# Patient Record
Sex: Female | Born: 1985 | Race: White | Hispanic: No | Marital: Married | Smoking: Never smoker
Health system: Southern US, Community
[De-identification: ages and names within clinical notes are randomized; demographics above are authoritative.]

## PROBLEM LIST (undated history)

## (undated) ENCOUNTER — Inpatient Hospital Stay (HOSPITAL_COMMUNITY): Payer: Self-pay

## (undated) DIAGNOSIS — E079 Disorder of thyroid, unspecified: Secondary | ICD-10-CM

## (undated) DIAGNOSIS — K219 Gastro-esophageal reflux disease without esophagitis: Secondary | ICD-10-CM

## (undated) DIAGNOSIS — D649 Anemia, unspecified: Secondary | ICD-10-CM

## (undated) DIAGNOSIS — E282 Polycystic ovarian syndrome: Secondary | ICD-10-CM

## (undated) HISTORY — DX: Gastro-esophageal reflux disease without esophagitis: K21.9

## (undated) HISTORY — DX: Anemia, unspecified: D64.9

---
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
## 2010-06-30 ENCOUNTER — Ambulatory Visit (HOSPITAL_COMMUNITY): Admission: RE | Admit: 2010-06-30 | Discharge: 2010-06-30 | Payer: Self-pay | Admitting: Obstetrics and Gynecology

## 2010-07-20 ENCOUNTER — Ambulatory Visit (HOSPITAL_COMMUNITY)
Admission: RE | Admit: 2010-07-20 | Discharge: 2010-07-20 | Payer: Self-pay | Source: Home / Self Care | Admitting: Obstetrics and Gynecology

## 2010-09-02 ENCOUNTER — Inpatient Hospital Stay (HOSPITAL_COMMUNITY): Admission: AD | Admit: 2010-09-02 | Payer: Self-pay | Admitting: Obstetrics and Gynecology

## 2010-10-25 ENCOUNTER — Inpatient Hospital Stay (HOSPITAL_COMMUNITY)
Admission: AD | Admit: 2010-10-25 | Discharge: 2010-10-25 | Disposition: A | Discharge status: Home / Self Care | Payer: PRIVATE HEALTH INSURANCE | Source: Ambulatory Visit | Attending: Obstetrics and Gynecology | Admitting: Obstetrics and Gynecology

## 2010-10-25 DIAGNOSIS — O4700 False labor before 37 completed weeks of gestation, unspecified trimester: Secondary | ICD-10-CM | POA: Insufficient documentation

## 2010-10-29 ENCOUNTER — Inpatient Hospital Stay (HOSPITAL_COMMUNITY)
Admission: AD | Admit: 2010-10-29 | Discharge: 2010-10-29 | Disposition: A | Discharge status: Home / Self Care | Payer: PRIVATE HEALTH INSURANCE | Source: Ambulatory Visit | Attending: Obstetrics and Gynecology | Admitting: Obstetrics and Gynecology

## 2010-10-29 DIAGNOSIS — O4700 False labor before 37 completed weeks of gestation, unspecified trimester: Secondary | ICD-10-CM | POA: Insufficient documentation

## 2010-11-10 ENCOUNTER — Inpatient Hospital Stay (HOSPITAL_COMMUNITY)
Admission: AD | Admit: 2010-11-10 | Discharge: 2010-11-10 | Disposition: A | Discharge status: Home / Self Care | Payer: PRIVATE HEALTH INSURANCE | Source: Ambulatory Visit | Attending: Obstetrics and Gynecology | Admitting: Obstetrics and Gynecology

## 2010-11-10 DIAGNOSIS — O479 False labor, unspecified: Secondary | ICD-10-CM | POA: Insufficient documentation

## 2010-11-23 ENCOUNTER — Inpatient Hospital Stay (HOSPITAL_COMMUNITY)
Admission: AD | Admit: 2010-11-23 | Discharge: 2010-11-26 | DRG: 775 | Disposition: A | Discharge status: Home / Self Care | Payer: PRIVATE HEALTH INSURANCE | Source: Ambulatory Visit | Attending: Obstetrics and Gynecology | Admitting: Obstetrics and Gynecology

## 2010-11-23 DIAGNOSIS — Z22330 Carrier of Group B streptococcus: Secondary | ICD-10-CM

## 2010-11-23 DIAGNOSIS — O99892 Other specified diseases and conditions complicating childbirth: Secondary | ICD-10-CM | POA: Diagnosis present

## 2010-11-23 LAB — CBC
HCT: 33.1 % — ABNORMAL LOW (ref 36.0–46.0)
Hemoglobin: 10.2 g/dL — ABNORMAL LOW (ref 12.0–15.0)
MCH: 24.1 pg — ABNORMAL LOW (ref 26.0–34.0)
MCHC: 30.8 g/dL (ref 30.0–36.0)
MCV: 78.1 fL (ref 78.0–100.0)
RDW: 16.3 % — ABNORMAL HIGH (ref 11.5–15.5)

## 2010-11-25 LAB — CBC
HCT: 29.4 % — ABNORMAL LOW (ref 36.0–46.0)
MCH: 24 pg — ABNORMAL LOW (ref 26.0–34.0)
MCV: 78.4 fL (ref 78.0–100.0)
Platelets: 192 10*3/uL (ref 150–400)
RDW: 16.2 % — ABNORMAL HIGH (ref 11.5–15.5)

## 2011-09-09 IMAGING — US US OB DETAIL+14 WK
1 series · 12 of 13 positions shown · non-contrast
Comparison: none

[Series 1: us ob detail+14 wk · 12 of 13 slices shown]
[im 1/13]
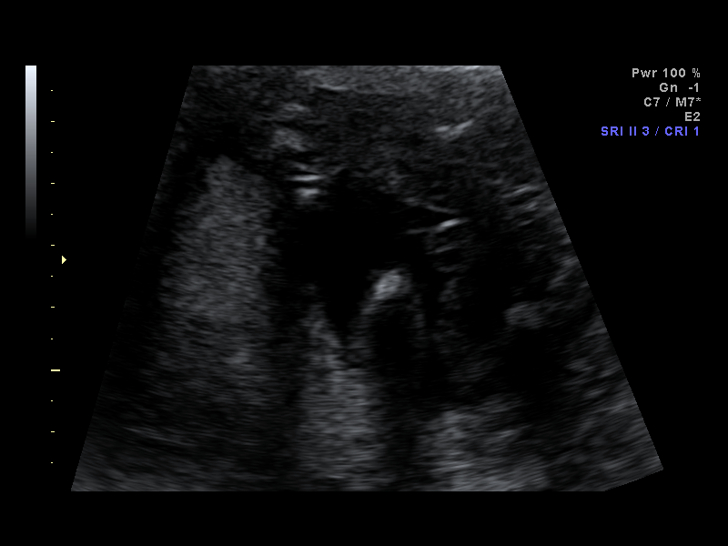
[im 2/13]
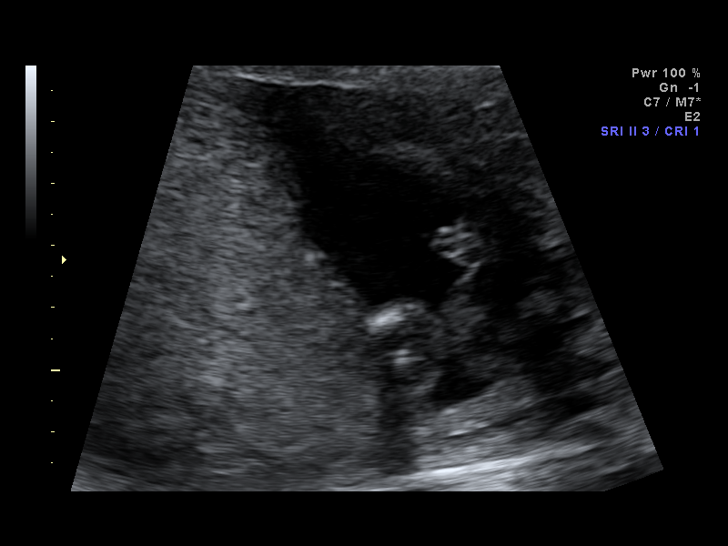
[im 3/13]
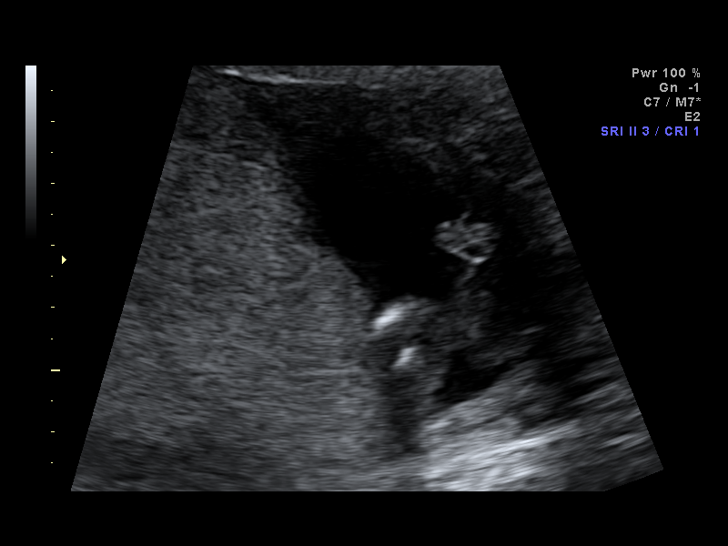
[im 4/13]
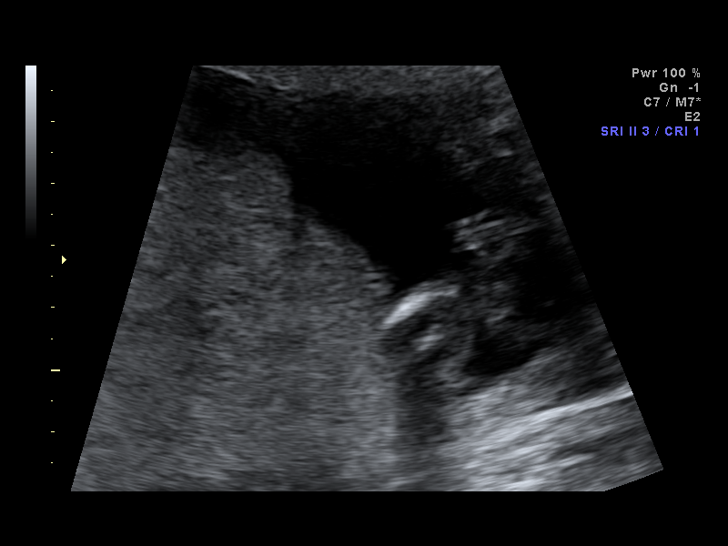
[im 5/13]
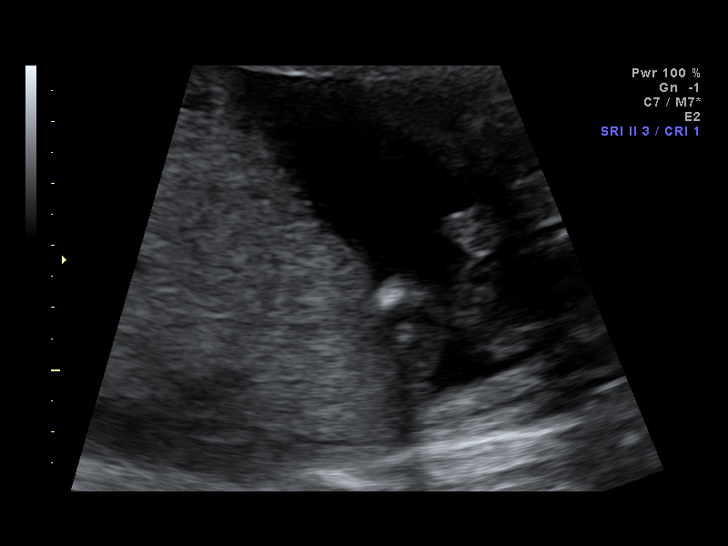
[im 6/13]
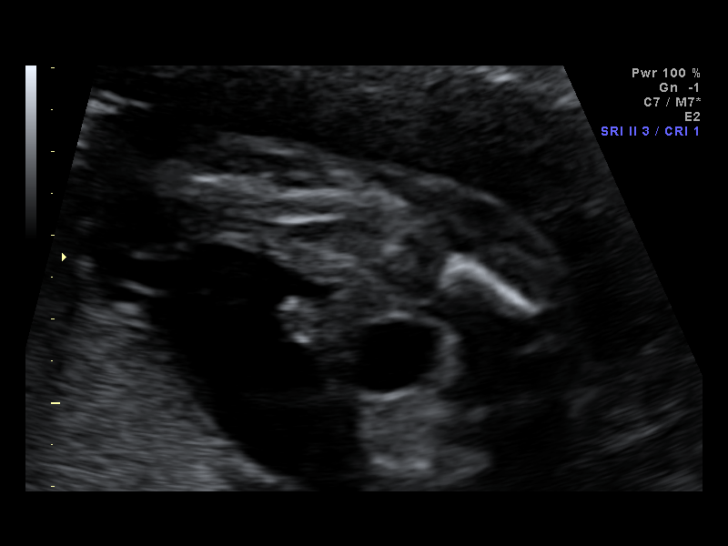
[im 8/13]
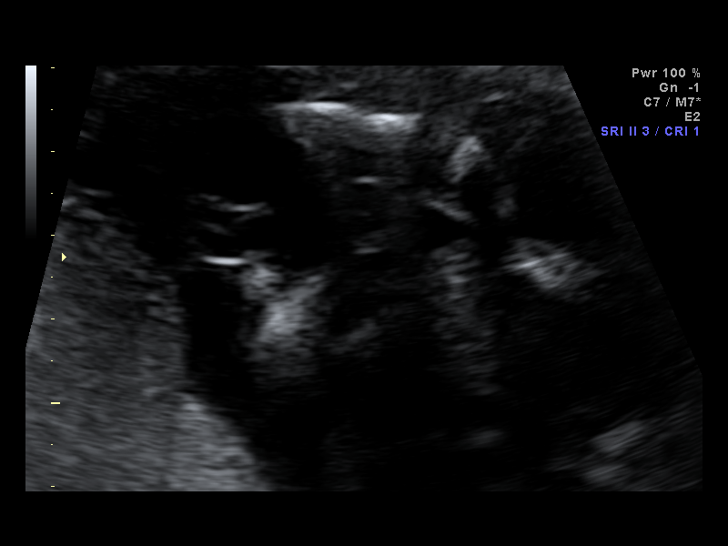
[im 9/13]
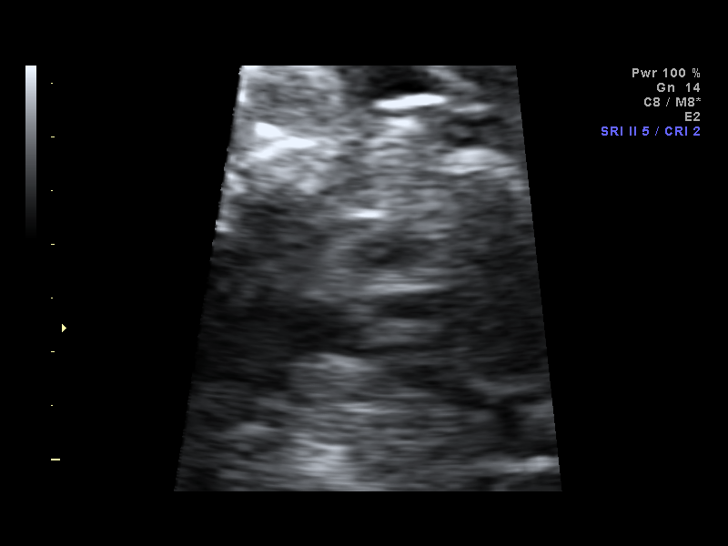
[im 10/13]
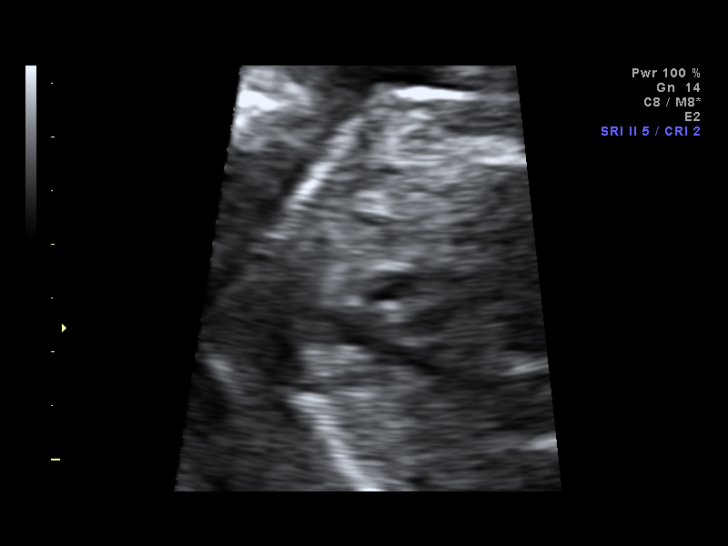
[im 11/13]
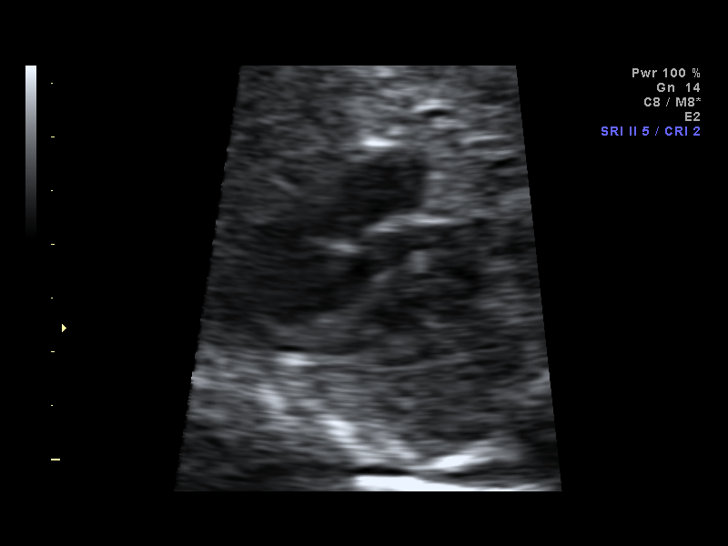
[im 12/13]
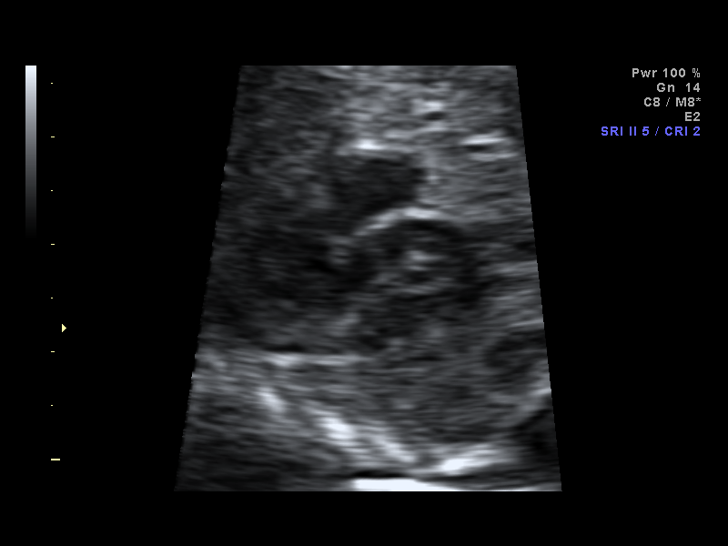
[im 13/13]
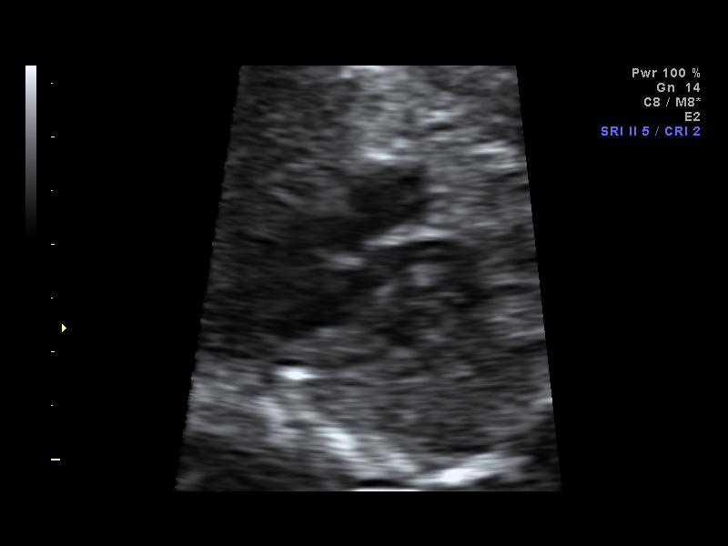

[12 of 13 positions shown; findings below may reference images not displayed]

OBSTETRICS REPORT
                      (Signed Final 08/01/2010 [DATE])

 Order#:         _O
Procedures

 US OB DETAIL +14 WK                                   76811.0
Indications

 Detailed fetal anatomic survey
 Low amniotic fluuid on outside scan
 Obesity
Fetal Evaluation

 Fetal Heart Rate:  145                          bpm
 Cardiac Activity:  Observed
 Presentation:      Breech
 Placenta:          Posterior, above cervical
                    os

 Amniotic Fluid
 AFI FV:      Subjectively within normal limits
                                             Larg Pckt:     3.6  cm
Biometry

 BPD:     42.7  mm     G. Age:  18w 6d                CI:         72.7   70 - 86
 OFD:     58.7  mm                                    FL/HC:      18.8   16.1 -

 HC:     163.7  mm     G. Age:  19w 1d       42  %    HC/AC:      1.15   1.09 -

 AC:       142  mm     G. Age:  19w 4d       59  %    FL/BPD:
 FL:      30.7  mm     G. Age:  19w 4d       56  %    FL/AC:      21.6   20 - 24
 HUM:     29.3  mm     G. Age:  19w 4d       61  %
 CER:     21.2  mm     G. Age:  20w 1d       72  %
 NFT:     3.28  mm

 Est. FW:     295  gm    0 lb 10 oz      51  %
Gestational Age

 U/S Today:     19w 2d                                        EDD:   11/22/10
 Best:          19w 1d     Det. By:  Previous Ultrasound      EDD:   11/23/10
Anatomy
 Cranium:           Appears normal      Aortic Arch:       Appears normal
 Fetal Cavum:       Appears normal      Ductal Arch:       Appears normal
 Ventricles:        Appears normal      Diaphragm:         Appears normal
 Choroid Plexus:    Appears normal      Stomach:           Appears normal
 Cerebellum:        Appears normal      Abdomen:           Appears normal
 Posterior Fossa:   Appears normal      Abdominal Wall:    Appears nml
                                                           (cord insert,
                                                           abd wall)
 Nuchal Fold:       Appears normal      Cord Vessels:      Appears normal
                    (neck, nuchal                          (3 vessel cord)
                    fold)
 Face:              Appears normal      Kidneys:           Appear normal
                    (lips/profile/orbit
                    s)
 Heart:             Appears normal      Bladder:           Appears normal
                    (4 chamber &
                    axis)
 RVOT:              Appears normal      Spine:             Appears normal
 LVOT:              Appears normal      Limbs:             Appears normal
                                                           (hands, ankles,
                                                           feet)

 Other:     Technically difficult due to  maternal habitus. Heels and
            5th digit visualized.
Cervix Uterus Adnexa

 Cervical Length:    3.4      cm

 Cervix:       Normal appearance by transabdominal scan.
 Left Ovary:    Not seen separate form a simple cyst 6.5x8.7x8.5 cm
 Right Ovary:   Within normal limits.
Impression

 IUP at 19+1 weeks
 Normal level II fetal anatomy; gender not easily determined -
 ? ambiguous: small phallus identified
 Markers of aneuploidy: none
 Normal amniotic fluid volume
 Measurements consistant with prior U/S

 The US findings were shared with Ms. Wilmer Ricardo and her
 husband. We briefly talked the possibility of the fetus having
 ambiguous genitalia and the implications of that diagnosis
 prenatally.
Recommendations

 Follow-up ultrasound for reassessment of genitalia in 3
 weeks

 questions or concerns.

## 2011-09-29 IMAGING — US US OB FOLLOW-UP
1 series · 12 of 28 positions shown · non-contrast
Comparison: none

[Series 1: us ob follow-up · 0.17mm/px · 12 of 52 slices shown]
[im 2/52]
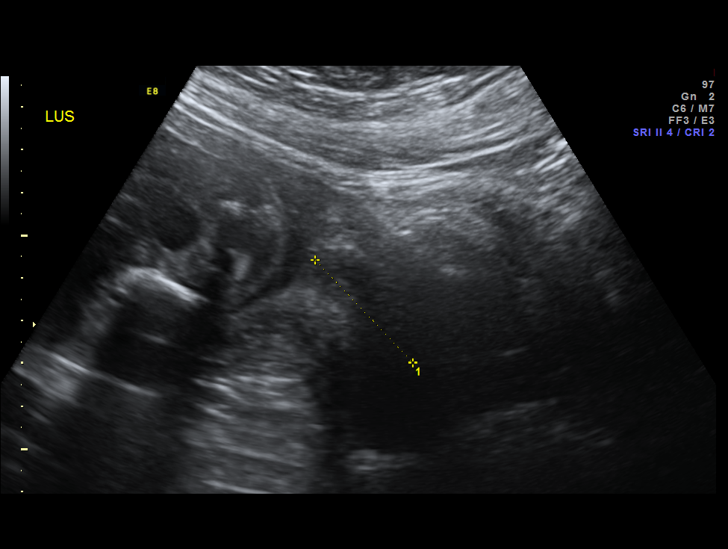
[im 6/52]
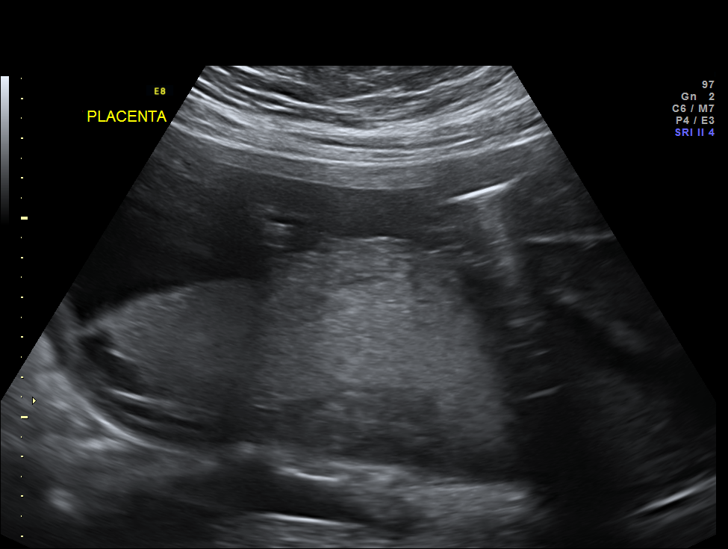
[im 10/52]
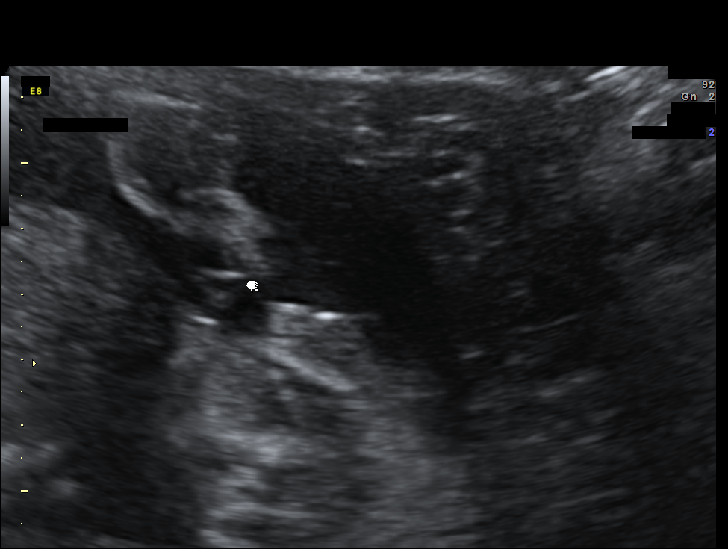
[im 16/52]
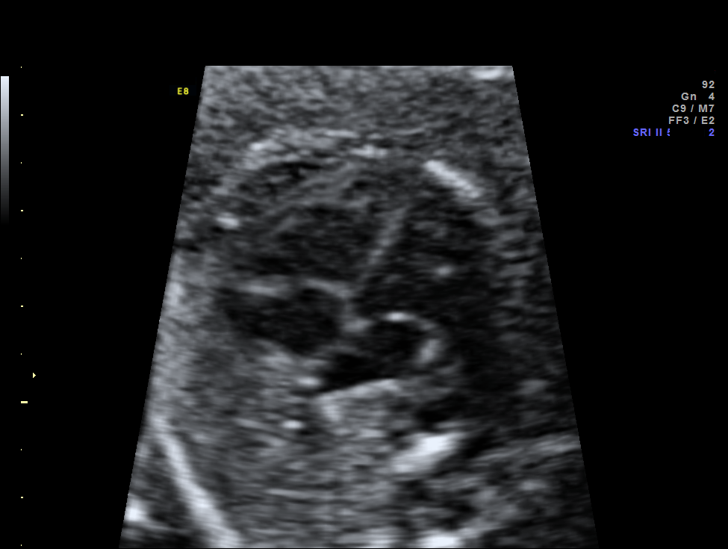
[im 19/52]
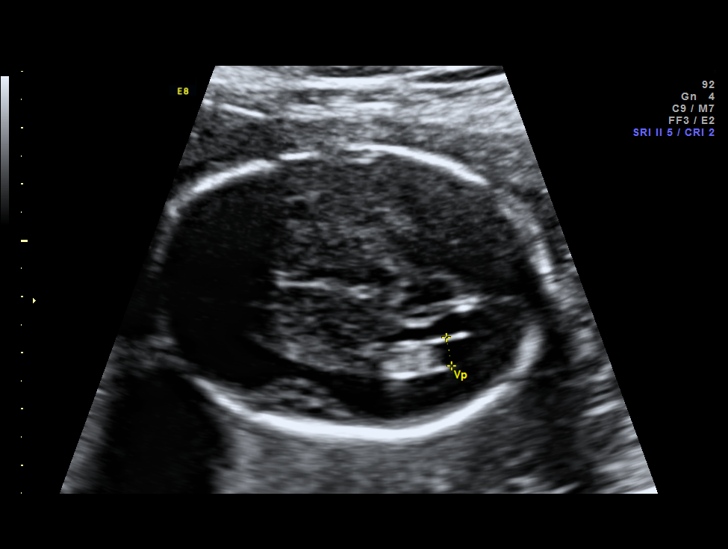
[im 23/52]
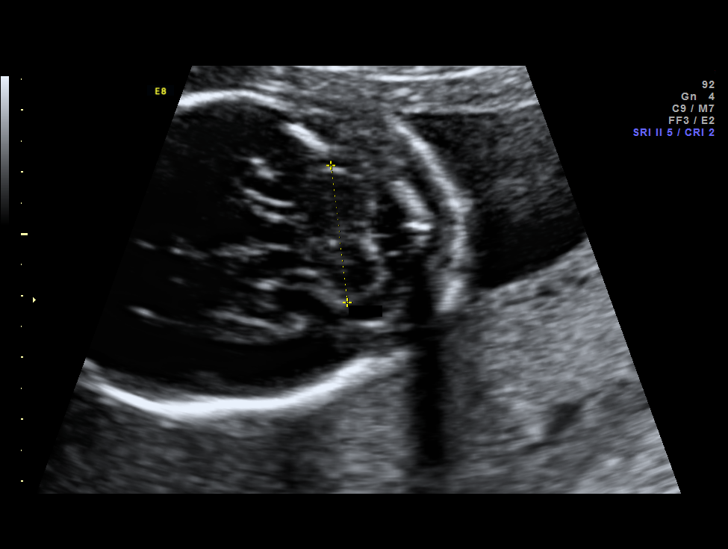
[im 29/52]
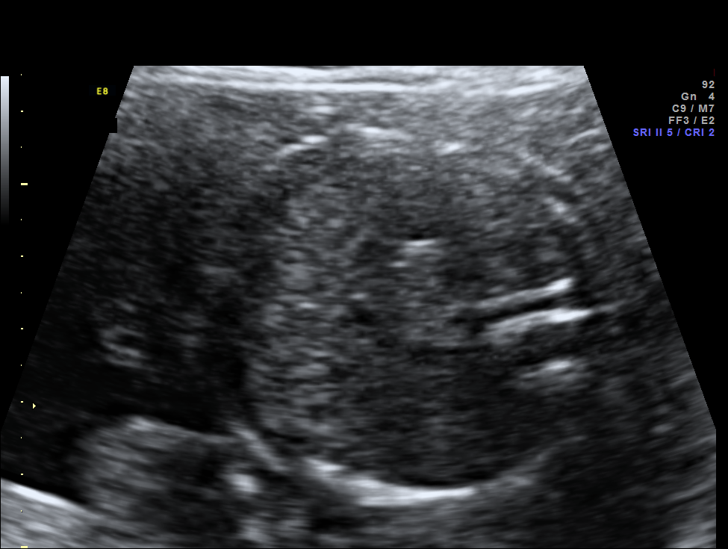
[im 33/52]
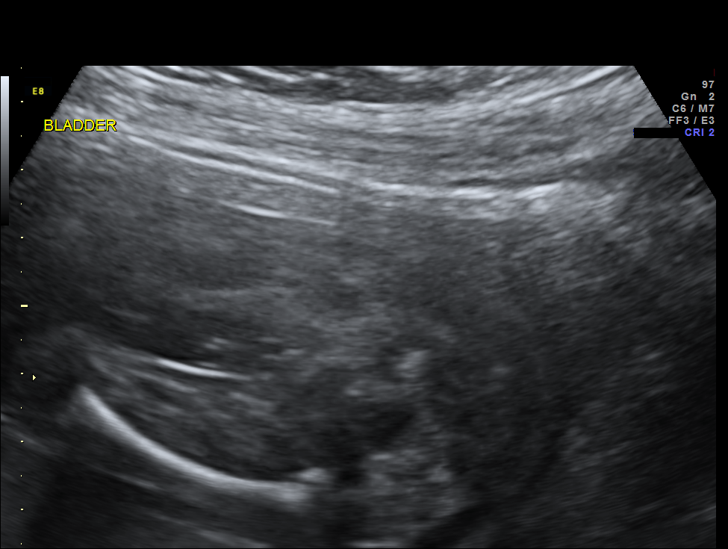
[im 36/52]
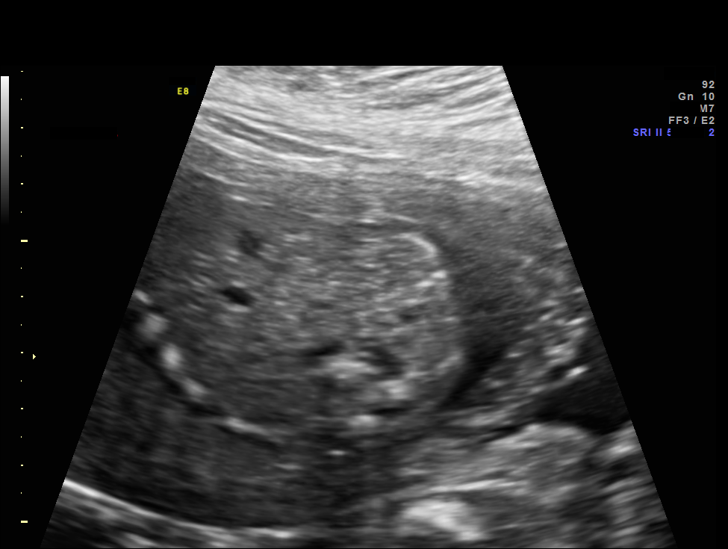
[im 42/52]
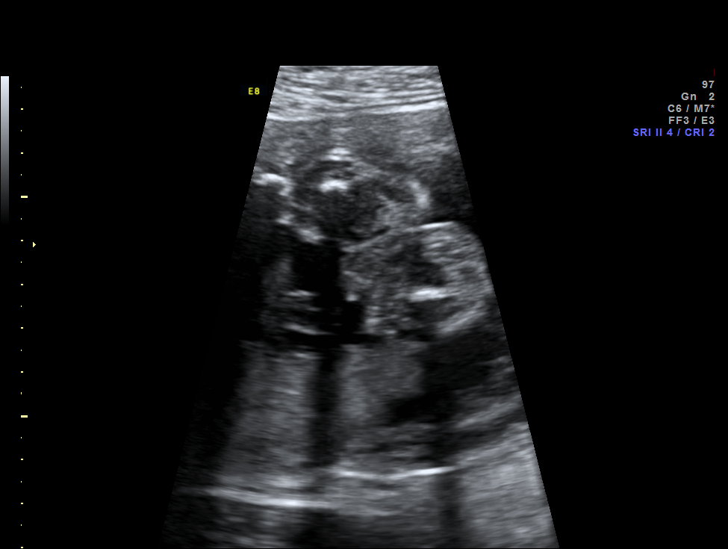
[im 46/52]
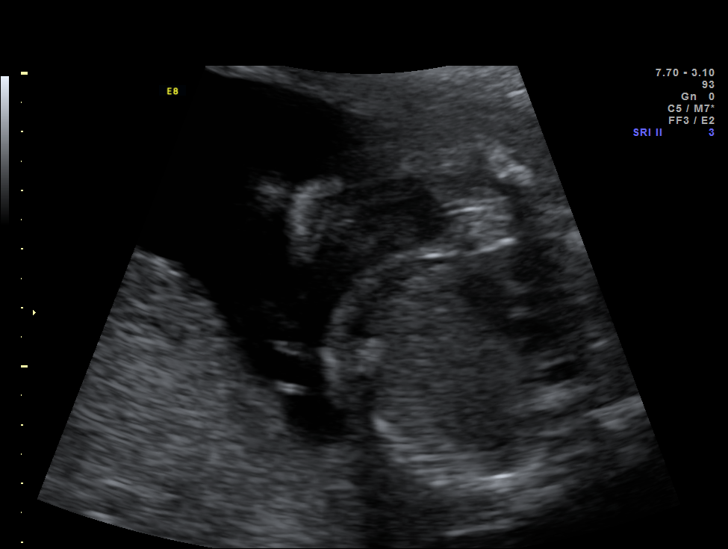
[im 50/52]
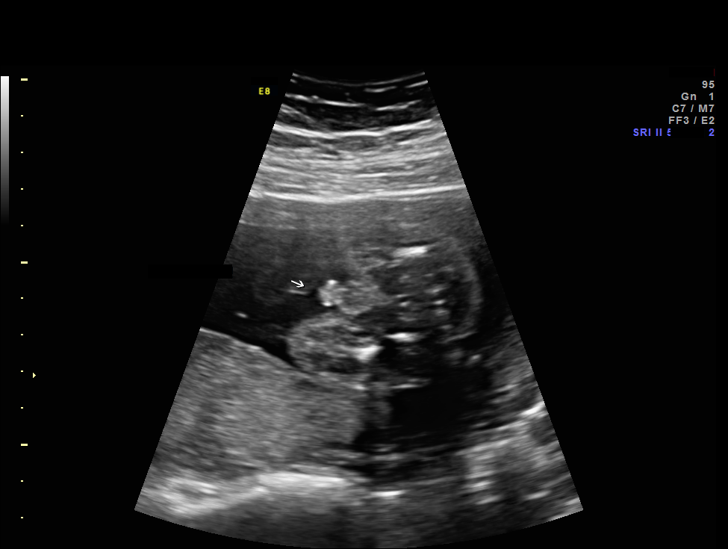

[12 of 28 positions shown; findings below may reference images not displayed]

OBSTETRICS REPORT
                      (Signed Final 08/01/2010 [DATE])

 Order#:         _O
Procedures

 US OB FOLLOW UP                                       76816.1
Indications

 Low amniotic fluid on outside scan
 Obesity
 Assess Fetal Growth / Estimated Fetal Weight
Fetal Evaluation

 Fetal Heart Rate:  156                          bpm
 Cardiac Activity:  Observed
 Presentation:      Breech
 Placenta:          Posterior, above cervical
                    os

 Amniotic Fluid
 AFI FV:      Subjectively within normal limits
Biometry

 BPD:     50.9  mm     G. Age:  21w 3d                CI:         67.2   70 - 86
 OFD:     75.7  mm                                    FL/HC:      20.2   18.4 -

 HC:     203.8  mm     G. Age:  22w 3d       59  %    HC/AC:      1.19   1.06 -

 AC:     171.3  mm     G. Age:  22w 1d       46  %    FL/BPD:     80.9   71 - 87
 FL:      41.2  mm     G. Age:  23w 3d       82  %    FL/AC:      24.1   20 - 24
 HUM:     37.6  mm     G. Age:  23w 1d       79  %
 CER:     20.9  mm     G. Age:  19w 6d      < 5  %

 Est. FW:     518  gm      1 lb 2 oz     56  %
Gestational Age

 U/S Today:     22w 3d                                        EDD:   11/20/10
 Best:          22w 0d     Det. By:  Previous Ultrasound      EDD:   11/23/10
Anatomy

 Cranium:           Appears normal      Aortic Arch:       Previously seen
 Fetal Cavum:       Appears normal      Ductal Arch:       Appears normal
 Ventricles:        Appears normal      Diaphragm:         Appears normal
 Choroid Plexus:    Appears normal      Stomach:           Appears
                                                           normal, left
                                                           sided
 Cerebellum:        Appears normal      Abdomen:           Appears normal
 Posterior Fossa:   Appears normal      Abdominal Wall:    Appears nml
                                                           (cord insert,
                                                           abd wall)
 Nuchal Fold:       Previously seen     Cord Vessels:      Appears normal
                                                           (3 vessel cord)
 Face:              Appears normal      Kidneys:           Appear normal
                    (lips/profile/orbit
                    s)
 Heart:             Appears normal      Bladder:           Appears normal
                    (4 chamber &
                    axis)
 RVOT:              Appears normal      Spine:             Previously seen
 LVOT:              Appears normal      Limbs:             Previously seen

 Other:     Technically difficult due to  maternal habitus. Heels and
            5th digit previously visualized.
Cervix Uterus Adnexa

 Cervical Length:    3.3      cm

 Cervix:       Normal appearance by transabdominal scan.
 Uterus:       No abnormality visualized.

 Left Ovary:    Not visualized.
 Right Ovary:   Not visualized.
 Adnexa:     No adnexal mass visualized.
Impression

 IUP at 22+0 weeks
 Normal interval anatomy; genitalia appeared to be male,
 normal or ? mild form of hypospadius
 Normal amniotic fluid volume
 Appropriate interval growth with EFW at the 56th %tile

 The US findings were shared with Ms. Brust and her
 husband.
Recommendations

 Follow-up as clinically indicated.

 questions or concerns.

## 2013-06-09 LAB — OB RESULTS CONSOLE GC/CHLAMYDIA
Chlamydia: NEGATIVE
Gonorrhea: NEGATIVE

## 2013-06-09 LAB — OB RESULTS CONSOLE RPR: RPR: NONREACTIVE

## 2013-06-09 LAB — OB RESULTS CONSOLE ABO/RH: RH TYPE: POSITIVE

## 2013-06-09 LAB — OB RESULTS CONSOLE RUBELLA ANTIBODY, IGM: RUBELLA: IMMUNE

## 2013-06-09 LAB — OB RESULTS CONSOLE ANTIBODY SCREEN: Antibody Screen: NEGATIVE

## 2013-06-09 LAB — OB RESULTS CONSOLE HEPATITIS B SURFACE ANTIGEN: Hepatitis B Surface Ag: NEGATIVE

## 2013-06-09 LAB — OB RESULTS CONSOLE HIV ANTIBODY (ROUTINE TESTING): HIV: NONREACTIVE

## 2013-09-03 NOTE — L&D Delivery Note (Signed)
SVD of VMI at 1957 at 01/18/14.  APGARs 9,9.  EBL 300cc.  Placenta to L&D. Just prior to delivery, I checked the patient and she was 6/c/0.  She requested epidural and I stepped out of the room for anesthesia.  Two minutes later, I was called emergently for precipitous delivery.  I arrived in the room as baby was being placed on the mother's abdomen with vigorous cry.  Per RN and anesthesia, when anesthesia entered the room, the patient jumped out of the bed and ran to the bathroom so no anesthesia was administered.  She returned to the bed and the baby's head immediately delivered ROA with body following atraumatically.  On my arrival, I clamped and cut the cord.  Cord pH was obtained.  Placenta delivered S/I/3VC.  Fundus was firmed with pitocin and massage.  Perineum intact.  Mom and baby stable.

## 2013-12-14 ENCOUNTER — Ambulatory Visit (HOSPITAL_COMMUNITY)
Admission: RE | Admit: 2013-12-14 | Discharge: 2013-12-14 | Disposition: A | Discharge status: Home / Self Care | Payer: 59 | Source: Ambulatory Visit | Attending: Obstetrics and Gynecology | Admitting: Obstetrics and Gynecology

## 2013-12-14 ENCOUNTER — Other Ambulatory Visit (HOSPITAL_COMMUNITY): Payer: Self-pay | Admitting: Obstetrics and Gynecology

## 2013-12-14 DIAGNOSIS — M7989 Other specified soft tissue disorders: Secondary | ICD-10-CM

## 2013-12-14 DIAGNOSIS — M79609 Pain in unspecified limb: Secondary | ICD-10-CM

## 2013-12-14 NOTE — Progress Notes (Signed)
*  PRELIMINARY RESULTS* Vascular Ultrasound Left lower extremity venous duplex has been completed.  Preliminary findings:  Left:  No evidence of DVT, superficial thrombosis, or Baker's cyst.  RN for Dr. Garlan FillersAdkin's is not available. Gave verbal report to Sprint Nextel CorporationKim.   Glendale ChardJill I Eunice RVT 12/14/2013, 12:57 PM

## 2014-01-03 ENCOUNTER — Encounter (HOSPITAL_COMMUNITY): Payer: Self-pay | Admitting: *Deleted

## 2014-01-03 ENCOUNTER — Inpatient Hospital Stay (HOSPITAL_COMMUNITY)
Admission: AD | Admit: 2014-01-03 | Discharge: 2014-01-03 | Disposition: A | Discharge status: Home / Self Care | Payer: 59 | Source: Ambulatory Visit | Attending: Obstetrics & Gynecology | Admitting: Obstetrics & Gynecology

## 2014-01-03 DIAGNOSIS — O99891 Other specified diseases and conditions complicating pregnancy: Secondary | ICD-10-CM | POA: Insufficient documentation

## 2014-01-03 DIAGNOSIS — M549 Dorsalgia, unspecified: Secondary | ICD-10-CM | POA: Insufficient documentation

## 2014-01-03 DIAGNOSIS — O9989 Other specified diseases and conditions complicating pregnancy, childbirth and the puerperium: Principal | ICD-10-CM

## 2014-01-03 LAB — URINALYSIS, ROUTINE W REFLEX MICROSCOPIC
Bilirubin Urine: NEGATIVE
GLUCOSE, UA: NEGATIVE mg/dL
Hgb urine dipstick: NEGATIVE
Ketones, ur: NEGATIVE mg/dL
Nitrite: NEGATIVE
PROTEIN: NEGATIVE mg/dL
Urobilinogen, UA: 0.2 mg/dL (ref 0.0–1.0)
pH: 5.5 (ref 5.0–8.0)

## 2014-01-03 LAB — URINE MICROSCOPIC-ADD ON

## 2014-01-03 NOTE — MAU Note (Signed)
Pt presents to MAU with complaints of back pain that started today and she called the on call nurse and was told to come in and be evaluated for contractions. Pt denies any Vaginal bleeding but states some mucus discharge.

## 2014-01-03 NOTE — Discharge Instructions (Signed)

## 2014-01-13 ENCOUNTER — Encounter (HOSPITAL_COMMUNITY): Payer: Self-pay | Admitting: *Deleted

## 2014-01-13 ENCOUNTER — Telehealth (HOSPITAL_COMMUNITY): Payer: Self-pay | Admitting: *Deleted

## 2014-01-13 LAB — OB RESULTS CONSOLE GBS: GBS: NEGATIVE

## 2014-01-13 NOTE — Telephone Encounter (Signed)
Preadmission screen  

## 2014-01-15 ENCOUNTER — Observation Stay (HOSPITAL_COMMUNITY)
Admission: RE | Admit: 2014-01-15 | Discharge: 2014-01-15 | Disposition: A | Discharge status: Home / Self Care | Payer: 59 | Source: Ambulatory Visit | Attending: Obstetrics and Gynecology | Admitting: Obstetrics and Gynecology

## 2014-01-15 ENCOUNTER — Encounter (HOSPITAL_COMMUNITY): Payer: Self-pay

## 2014-01-15 DIAGNOSIS — O328XX0 Maternal care for other malpresentation of fetus, not applicable or unspecified: Principal | ICD-10-CM | POA: Insufficient documentation

## 2014-01-15 MED ORDER — CITRIC ACID-SODIUM CITRATE 334-500 MG/5ML PO SOLN
ORAL | Status: AC
  Filled 2014-01-15: qty 15

## 2014-01-15 NOTE — H&P (Signed)
Patient here for scheduled induction  Cervix is 30%/ 2 cm presenting part high Ultrasound footling breech I question possible unstable lie Patient absolutely does not want a C Section Options: 1. ECV - patient counseled about risks wants to consider 2. C Section -  3.  Reevaluate on Monday for presentation and induce if vertex Patient wants to go home  Will call office on Monday am to schedule appointment

## 2014-01-15 NOTE — Progress Notes (Signed)
Discharge instructions given, pt verbalizes and understands, pt discharged home

## 2014-01-16 NOTE — Discharge Summary (Signed)
This patient is 28 year old female originally admitted for elective induction for history of rapid labor. During my initial evaluation, I  Discovered fetus in double footling breech position. She is not in labor and cervix was long and 2 cm dilated. Patient counseled extensively by myself about her options - attempted ECV versus C Section. She refused C Section and wanted to consider ECV. She had not been NPO and we both agreed for her to follow up Monday am in the office. If the baby is not vertex, then she will consider an ECV. She is aware that if she is in labor in the interim that she will have to have a C Section. She and her husband verbalized understanding  Wynona CanesChristine, her nurse, was present for the conversation.

## 2014-01-18 ENCOUNTER — Encounter (HOSPITAL_COMMUNITY): Payer: Self-pay | Admitting: Anesthesiology

## 2014-01-18 ENCOUNTER — Inpatient Hospital Stay (HOSPITAL_COMMUNITY)
Admission: AD | Admit: 2014-01-18 | Discharge: 2014-01-20 | DRG: 775 | Disposition: A | Discharge status: Home / Self Care | Payer: 59 | Source: Ambulatory Visit | Attending: Obstetrics & Gynecology | Admitting: Obstetrics & Gynecology

## 2014-01-18 ENCOUNTER — Encounter (HOSPITAL_COMMUNITY): Payer: Self-pay | Admitting: *Deleted

## 2014-01-18 DIAGNOSIS — O480 Post-term pregnancy: Principal | ICD-10-CM | POA: Diagnosis present

## 2014-01-18 DIAGNOSIS — K219 Gastro-esophageal reflux disease without esophagitis: Secondary | ICD-10-CM | POA: Diagnosis present

## 2014-01-18 DIAGNOSIS — Z3490 Encounter for supervision of normal pregnancy, unspecified, unspecified trimester: Secondary | ICD-10-CM

## 2014-01-18 DIAGNOSIS — Z833 Family history of diabetes mellitus: Secondary | ICD-10-CM

## 2014-01-18 LAB — CBC
HEMATOCRIT: 38 % (ref 36.0–46.0)
Hemoglobin: 11.9 g/dL — ABNORMAL LOW (ref 12.0–15.0)
MCH: 25.1 pg — AB (ref 26.0–34.0)
MCHC: 31.3 g/dL (ref 30.0–36.0)
MCV: 80 fL (ref 78.0–100.0)
Platelets: 229 10*3/uL (ref 150–400)
RBC: 4.75 MIL/uL (ref 3.87–5.11)
RDW: 16.6 % — ABNORMAL HIGH (ref 11.5–15.5)
WBC: 10 10*3/uL (ref 4.0–10.5)

## 2014-01-18 LAB — TYPE AND SCREEN
ABO/RH(D): O POS
Antibody Screen: NEGATIVE

## 2014-01-18 LAB — ABO/RH: ABO/RH(D): O POS

## 2014-01-18 LAB — RPR

## 2014-01-18 MED ORDER — OXYCODONE-ACETAMINOPHEN 5-325 MG PO TABS: 1.0000 | ORAL_TABLET | ORAL | Status: DC | PRN

## 2014-01-18 MED ORDER — DIPHENHYDRAMINE HCL 25 MG PO CAPS: 25.0000 mg | ORAL_CAPSULE | Freq: Four times a day (QID) | ORAL | Status: DC | PRN

## 2014-01-18 MED ORDER — EPHEDRINE 5 MG/ML INJ
10.0000 mg | INTRAVENOUS | Status: DC | PRN
  Filled 2014-01-18: qty 2
  Filled 2014-01-18: qty 4

## 2014-01-18 MED ORDER — FENTANYL 2.5 MCG/ML BUPIVACAINE 1/10 % EPIDURAL INFUSION (WH - ANES)
14.0000 mL/h | INTRAMUSCULAR | Status: DC | PRN
Start: 2014-01-18 — End: 2014-01-18
  Filled 2014-01-18: qty 125

## 2014-01-18 MED ORDER — CITRIC ACID-SODIUM CITRATE 334-500 MG/5ML PO SOLN: 30.0000 mL | ORAL | Status: DC | PRN

## 2014-01-18 MED ORDER — TETANUS-DIPHTH-ACELL PERTUSSIS 5-2.5-18.5 LF-MCG/0.5 IM SUSP: 0.5000 mL | Freq: Once | INTRAMUSCULAR | Status: DC

## 2014-01-18 MED ORDER — LANOLIN HYDROUS EX OINT: TOPICAL_OINTMENT | CUTANEOUS | Status: DC | PRN

## 2014-01-18 MED ORDER — LACTATED RINGERS IV SOLN: 500.0000 mL | INTRAVENOUS | Status: DC | PRN

## 2014-01-18 MED ORDER — PRENATAL MULTIVITAMIN CH
1.0000 | ORAL_TABLET | Freq: Every day | ORAL | Status: DC
  Administered 2014-01-19 – 2014-01-20 (×2): 1 via ORAL
  Filled 2014-01-18 (×2): qty 1

## 2014-01-18 MED ORDER — EPHEDRINE 5 MG/ML INJ
10.0000 mg | INTRAVENOUS | Status: DC | PRN
  Filled 2014-01-18: qty 2

## 2014-01-18 MED ORDER — ONDANSETRON HCL 4 MG/2ML IJ SOLN: 4.0000 mg | INTRAMUSCULAR | Status: DC | PRN

## 2014-01-18 MED ORDER — IBUPROFEN 600 MG PO TABS
600.0000 mg | ORAL_TABLET | Freq: Four times a day (QID) | ORAL | Status: DC
  Administered 2014-01-18 – 2014-01-20 (×7): 600 mg via ORAL
  Filled 2014-01-18 (×7): qty 1

## 2014-01-18 MED ORDER — OXYTOCIN 40 UNITS IN LACTATED RINGERS INFUSION - SIMPLE MED
1.0000 m[IU]/min | INTRAVENOUS | Status: DC
  Administered 2014-01-18: 2 m[IU]/min via INTRAVENOUS
  Filled 2014-01-18: qty 1000

## 2014-01-18 MED ORDER — OXYTOCIN 40 UNITS IN LACTATED RINGERS INFUSION - SIMPLE MED: 62.5000 mL/h | INTRAVENOUS | Status: DC

## 2014-01-18 MED ORDER — OXYTOCIN BOLUS FROM INFUSION
500.0000 mL | INTRAVENOUS | Status: DC
  Administered 2014-01-18: 500 mL via INTRAVENOUS

## 2014-01-18 MED ORDER — OXYCODONE-ACETAMINOPHEN 5-325 MG PO TABS
1.0000 | ORAL_TABLET | ORAL | Status: DC | PRN
  Administered 2014-01-19: 1 via ORAL
  Filled 2014-01-18: qty 1

## 2014-01-18 MED ORDER — ONDANSETRON HCL 4 MG/2ML IJ SOLN: 4.0000 mg | Freq: Four times a day (QID) | INTRAMUSCULAR | Status: DC | PRN

## 2014-01-18 MED ORDER — METOCLOPRAMIDE HCL 5 MG/ML IJ SOLN
10.0000 mg | Freq: Once | INTRAMUSCULAR | Status: AC
  Administered 2014-01-18: 10 mg via INTRAVENOUS
  Filled 2014-01-18: qty 2

## 2014-01-18 MED ORDER — BENZOCAINE-MENTHOL 20-0.5 % EX AERO: 1.0000 "application " | INHALATION_SPRAY | CUTANEOUS | Status: DC | PRN

## 2014-01-18 MED ORDER — TERBUTALINE SULFATE 1 MG/ML IJ SOLN: 0.2500 mg | Freq: Once | INTRAMUSCULAR | Status: DC | PRN

## 2014-01-18 MED ORDER — DIBUCAINE 1 % RE OINT: 1.0000 "application " | TOPICAL_OINTMENT | RECTAL | Status: DC | PRN

## 2014-01-18 MED ORDER — DIPHENHYDRAMINE HCL 50 MG/ML IJ SOLN: 12.5000 mg | INTRAMUSCULAR | Status: DC | PRN

## 2014-01-18 MED ORDER — LIDOCAINE HCL (PF) 1 % IJ SOLN
30.0000 mL | INTRAMUSCULAR | Status: DC | PRN
  Filled 2014-01-18: qty 30

## 2014-01-18 MED ORDER — WITCH HAZEL-GLYCERIN EX PADS: 1.0000 "application " | MEDICATED_PAD | CUTANEOUS | Status: DC | PRN

## 2014-01-18 MED ORDER — PHENYLEPHRINE 40 MCG/ML (10ML) SYRINGE FOR IV PUSH (FOR BLOOD PRESSURE SUPPORT)
80.0000 ug | PREFILLED_SYRINGE | INTRAVENOUS | Status: DC | PRN
  Filled 2014-01-18: qty 10
  Filled 2014-01-18: qty 2

## 2014-01-18 MED ORDER — LACTATED RINGERS IV SOLN
500.0000 mL | Freq: Once | INTRAVENOUS | Status: AC
  Administered 2014-01-18: 500 mL via INTRAVENOUS

## 2014-01-18 MED ORDER — LACTATED RINGERS IV SOLN
INTRAVENOUS | Status: DC
  Administered 2014-01-18: 11:00:00 via INTRAVENOUS

## 2014-01-18 MED ORDER — ONDANSETRON HCL 4 MG PO TABS: 4.0000 mg | ORAL_TABLET | ORAL | Status: DC | PRN

## 2014-01-18 MED ORDER — LACTATED RINGERS IV SOLN
INTRAVENOUS | Status: DC
  Administered 2014-01-18: 19:00:00 via INTRAUTERINE

## 2014-01-18 MED ORDER — SENNOSIDES-DOCUSATE SODIUM 8.6-50 MG PO TABS
2.0000 | ORAL_TABLET | ORAL | Status: DC
  Administered 2014-01-19 – 2014-01-20 (×2): 2 via ORAL
  Filled 2014-01-18 (×2): qty 2

## 2014-01-18 MED ORDER — FLEET ENEMA 7-19 GM/118ML RE ENEM: 1.0000 | ENEMA | RECTAL | Status: DC | PRN

## 2014-01-18 MED ORDER — ACETAMINOPHEN 325 MG PO TABS: 650.0000 mg | ORAL_TABLET | ORAL | Status: DC | PRN

## 2014-01-18 MED ORDER — PHENYLEPHRINE 40 MCG/ML (10ML) SYRINGE FOR IV PUSH (FOR BLOOD PRESSURE SUPPORT)
80.0000 ug | PREFILLED_SYRINGE | INTRAVENOUS | Status: DC | PRN
  Filled 2014-01-18: qty 2

## 2014-01-18 MED ORDER — IBUPROFEN 600 MG PO TABS: 600.0000 mg | ORAL_TABLET | Freq: Four times a day (QID) | ORAL | Status: DC | PRN

## 2014-01-18 MED ORDER — SIMETHICONE 80 MG PO CHEW: 80.0000 mg | CHEWABLE_TABLET | ORAL | Status: DC | PRN

## 2014-01-18 MED ORDER — ZOLPIDEM TARTRATE 5 MG PO TABS: 5.0000 mg | ORAL_TABLET | Freq: Every evening | ORAL | Status: DC | PRN

## 2014-01-18 NOTE — H&P (Signed)
Katie George is a 28 y.o. female presenting for post-dates IOL.  Patient was initially scheduled for AROM IOL last Friday but was found to be breech.  She presented to the office for plan development.  Ultrasound confirmed vertex presentation.  Sent over from the office for induction.  Cervix 2/50/ballotable.  [place]  GBS negative.  Maternal Medical History:  Contractions: Onset was 1 week ago.   Frequency: rare.   Perceived severity is mild.    Fetal activity: Perceived fetal activity is normal.   Last perceived fetal movement was within the past hour.    Prenatal complications: no prenatal complications Prenatal Complications - Diabetes: none.    OB History   Grav Para Term Preterm Abortions TAB SAB Ect Mult Living   2 1 1       1      Past Medical History  Diagnosis Date  . GERD (gastroesophageal reflux disease)   . Anemia    Past Surgical History  Procedure Laterality Date  . Myringotomy  1990  . Adenoidectomy  1990   Family History: family history includes Anemia in her mother; Diabetes in her maternal grandmother and paternal grandfather; Heart disease in her maternal grandfather; Hypertension in her maternal grandmother and mother; Migraines in her mother. Social History:  reports that she has never smoked. She has never used smokeless tobacco. She reports that she does not drink alcohol or use illicit drugs.   Prenatal Transfer Tool  Maternal Diabetes: No Genetic Screening: Normal Maternal Ultrasounds/Referrals: Normal Fetal Ultrasounds or other Referrals:  None Maternal Substance Abuse:  No Significant Maternal Medications:  None Significant Maternal Lab Results:  Lab values include: Group B Strep negative Other Comments:  None  ROS  Dilation: 2.5 Effacement (%): 40;50 Station: Ballotable Exam by:: Dr. Langston George Blood pressure 132/84, pulse 88, temperature 98.4 F (36.9 C), temperature source Oral, resp. rate 18, height 5\' 4"   (1.626 m), weight 263 lb (119.296 kg), last menstrual period 04/10/2013. Maternal Exam:  Uterine Assessment: Contraction strength is mild.  Contraction frequency is irregular.   Abdomen: Fundal height is c/w dates.   Estimated fetal weight is 8#.   Fetal presentation: vertex  Introitus: Normal vulva. Normal vagina.  Pelvis: adequate for delivery.   Cervix: Cervix evaluated by digital exam.     Physical Exam  Constitutional: She is oriented to person, place, and time. She appears well-developed and well-nourished.  GI: Soft. There is no rebound and no guarding.  Neurological: She is alert and oriented to person, place, and time.  Skin: Skin is warm and dry.  Psychiatric: She has a normal mood and affect. Her behavior is normal.    Prenatal labs: ABO, Rh: O/Positive/-- (10/07 0000) Antibody: Negative (10/07 0000) Rubella: Immune (10/07 0000) RPR: Nonreactive (10/07 0000)  HBsAg: Negative (10/07 0000)  HIV: Non-reactive (10/07 0000)  GBS: Negative (05/13 0000)   Assessment/Plan: 27yo G2P1001 at 284w3d for induction -Pitocin induction -Pt not planning epidural  -Will AROM when able -Anticipate NSVD  Katie George 01/18/2014, 1:42 PM

## 2014-01-18 NOTE — Progress Notes (Signed)
Patient declines zofran for nausea/vomiting.

## 2014-01-18 NOTE — Progress Notes (Signed)
Dr. Langston MaskerMorris notified of decels, pt vomiting, and of sve. Orders for reglan and amnioinfusion of 500ml bolus received.

## 2014-01-19 LAB — CBC
HCT: 33.3 % — ABNORMAL LOW (ref 36.0–46.0)
HEMOGLOBIN: 10.4 g/dL — AB (ref 12.0–15.0)
MCH: 25 pg — AB (ref 26.0–34.0)
MCHC: 31.2 g/dL (ref 30.0–36.0)
MCV: 80 fL (ref 78.0–100.0)
PLATELETS: 204 10*3/uL (ref 150–400)
RBC: 4.16 MIL/uL (ref 3.87–5.11)
RDW: 16.7 % — ABNORMAL HIGH (ref 11.5–15.5)
WBC: 13 10*3/uL — ABNORMAL HIGH (ref 4.0–10.5)

## 2014-01-19 NOTE — Lactation Note (Signed)
This note was copied from the chart of Boy Burnard LeighWhitney Fuhr. Lactation Consultation Note  Patient Name: Boy Burnard LeighWhitney Rominger [id number]'s Date: 01/19/2014 Reason for consult: Other (Comment) (charting for exclusion)   Maternal Data Formula Feeding for Exclusion: Yes Reason for exclusion: Mother's choice to formula feed on admision  Feeding Feeding Type: Formula Nipple Type: Slow - flow  LATCH Score/Interventions                      Lactation Tools Discussed/Used     Consult Status Consult Status: Complete    Zara ChessJoanne P Sharia Averitt 01/19/2014, 4:08 PM

## 2014-01-19 NOTE — Progress Notes (Signed)
Post Partum Day one Subjective: no complaints, voiding and tolerating PO  Objective: Blood pressure 118/81, pulse 85, temperature 98 F (36.7 C), temperature source Oral, resp. rate 20, height 5\' 4"  (1.626 m), weight 119.296 kg (263 lb), last menstrual period 04/10/2013, unknown if currently breastfeeding.  Physical Exam:  General: alert Lochia: appropriate Uterine Fundus: firm Incision: dehiscence present na DVT Evaluation: No evidence of DVT seen on physical exam.   Recent Labs  01/18/14 1120 01/19/14 0545  HGB 11.9* 10.4*  HCT 38.0 33.3*    Assessment/Plan: Plan for discharge tomorrow   LOS: 1 day   Juluis MireJohn S Christianne Zacher 01/19/2014, 7:55 AM

## 2014-01-20 MED ORDER — IBUPROFEN 600 MG PO TABS: 600.0000 mg | ORAL_TABLET | Freq: Four times a day (QID) | ORAL | Status: AC

## 2014-01-20 MED ORDER — OXYCODONE-ACETAMINOPHEN 5-325 MG PO TABS: 1.0000 | ORAL_TABLET | ORAL | Status: AC | PRN

## 2014-01-20 NOTE — Discharge Summary (Signed)
Obstetric Discharge Summary Reason for Admission: induction of labor Prenatal Procedures: ultrasound Intrapartum Procedures: spontaneous vaginal delivery Postpartum Procedures: none Complications-Operative and Postpartum: none Hemoglobin  Date Value Ref Range Status  01/19/2014 10.4* 12.0 - 15.0 g/dL Final     HCT  Date Value Ref Range Status  01/19/2014 33.3* 36.0 - 46.0 % Final    Physical Exam:  General: alert and cooperative Lochia: appropriate Uterine Fundus: firm Incision: perineum intact DVT Evaluation: No evidence of DVT seen on physical exam. Negative Homan's sign. No cords or calf tenderness. No significant calf/ankle edema.  Discharge Diagnoses: Term Pregnancy-delivered  Discharge Information: Date: 01/20/2014 Activity: pelvic rest Diet: routine Medications: PNV, Ibuprofen and Percocet Condition: stable Instructions: refer to practice specific booklet Discharge to: home   Newborn Data: Live born female  Birth Weight: 7 lb 4.2 oz (3294 g) APGAR: 9, 9  Home with mother.  Judith BlonderCarol G Curtis 01/20/2014, 8:13 AM

## 2014-07-05 ENCOUNTER — Encounter (HOSPITAL_COMMUNITY): Payer: Self-pay | Admitting: *Deleted

## 2015-09-08 ENCOUNTER — Emergency Department (HOSPITAL_COMMUNITY): Payer: 59

## 2015-09-08 ENCOUNTER — Emergency Department (HOSPITAL_COMMUNITY)
Admission: EM | Admit: 2015-09-08 | Discharge: 2015-09-08 | Disposition: A | Discharge status: Home / Self Care | Payer: 59 | Attending: Emergency Medicine | Admitting: Emergency Medicine

## 2015-09-08 ENCOUNTER — Encounter (HOSPITAL_COMMUNITY): Payer: Self-pay | Admitting: Neurology

## 2015-09-08 DIAGNOSIS — R112 Nausea with vomiting, unspecified: Secondary | ICD-10-CM | POA: Diagnosis not present

## 2015-09-08 DIAGNOSIS — R6883 Chills (without fever): Secondary | ICD-10-CM | POA: Insufficient documentation

## 2015-09-08 DIAGNOSIS — Z3202 Encounter for pregnancy test, result negative: Secondary | ICD-10-CM | POA: Diagnosis not present

## 2015-09-08 DIAGNOSIS — R1013 Epigastric pain: Secondary | ICD-10-CM | POA: Insufficient documentation

## 2015-09-08 DIAGNOSIS — R197 Diarrhea, unspecified: Secondary | ICD-10-CM | POA: Insufficient documentation

## 2015-09-08 DIAGNOSIS — Z791 Long term (current) use of non-steroidal anti-inflammatories (NSAID): Secondary | ICD-10-CM | POA: Diagnosis not present

## 2015-09-08 DIAGNOSIS — E079 Disorder of thyroid, unspecified: Secondary | ICD-10-CM | POA: Diagnosis not present

## 2015-09-08 DIAGNOSIS — Z79899 Other long term (current) drug therapy: Secondary | ICD-10-CM | POA: Insufficient documentation

## 2015-09-08 DIAGNOSIS — R1011 Right upper quadrant pain: Secondary | ICD-10-CM | POA: Insufficient documentation

## 2015-09-08 DIAGNOSIS — Z8719 Personal history of other diseases of the digestive system: Secondary | ICD-10-CM | POA: Insufficient documentation

## 2015-09-08 DIAGNOSIS — Z880 Allergy status to penicillin: Secondary | ICD-10-CM | POA: Insufficient documentation

## 2015-09-08 DIAGNOSIS — Z793 Long term (current) use of hormonal contraceptives: Secondary | ICD-10-CM | POA: Insufficient documentation

## 2015-09-08 DIAGNOSIS — Z862 Personal history of diseases of the blood and blood-forming organs and certain disorders involving the immune mechanism: Secondary | ICD-10-CM | POA: Insufficient documentation

## 2015-09-08 HISTORY — DX: Disorder of thyroid, unspecified: E07.9

## 2015-09-08 HISTORY — DX: Polycystic ovarian syndrome: E28.2

## 2015-09-08 LAB — COMPREHENSIVE METABOLIC PANEL
ALK PHOS: 66 U/L (ref 38–126)
ALT: 13 U/L — ABNORMAL LOW (ref 14–54)
AST: 14 U/L — AB (ref 15–41)
Albumin: 3.6 g/dL (ref 3.5–5.0)
Anion gap: 9 (ref 5–15)
BILIRUBIN TOTAL: 0.4 mg/dL (ref 0.3–1.2)
BUN: 14 mg/dL (ref 6–20)
CHLORIDE: 110 mmol/L (ref 101–111)
CO2: 20 mmol/L — ABNORMAL LOW (ref 22–32)
Calcium: 9 mg/dL (ref 8.9–10.3)
Creatinine, Ser: 0.89 mg/dL (ref 0.44–1.00)
GFR calc Af Amer: >60 mL/min (ref >60)
GFR calc non Af Amer: >60 mL/min (ref >60)
Glucose, Bld: 101 mg/dL — ABNORMAL HIGH (ref 65–99)
Potassium: 4.3 mmol/L (ref 3.5–5.1)
Sodium: 139 mmol/L (ref 135–145)
Total Protein: 6.9 g/dL (ref 6.5–8.1)

## 2015-09-08 LAB — URINE MICROSCOPIC-ADD ON: RBC / HPF: NONE SEEN RBC/hpf (ref 0–5)

## 2015-09-08 LAB — URINALYSIS, ROUTINE W REFLEX MICROSCOPIC
Bilirubin Urine: NEGATIVE
GLUCOSE, UA: NEGATIVE mg/dL
HGB URINE DIPSTICK: NEGATIVE
KETONES UR: NEGATIVE mg/dL
Nitrite: NEGATIVE
Protein, ur: NEGATIVE mg/dL
Specific Gravity, Urine: 1.025 (ref 1.005–1.030)
pH: 5.5 (ref 5.0–8.0)

## 2015-09-08 LAB — CBC
HCT: 42.3 % (ref 36.0–46.0)
Hemoglobin: 13.8 g/dL (ref 12.0–15.0)
MCH: 25.7 pg — ABNORMAL LOW (ref 26.0–34.0)
MCHC: 32.6 g/dL (ref 30.0–36.0)
MCV: 78.6 fL (ref 78.0–100.0)
Platelets: 291 10*3/uL (ref 150–400)
RBC: 5.38 MIL/uL — ABNORMAL HIGH (ref 3.87–5.11)
RDW: 15 % (ref 11.5–15.5)
WBC: 10.3 10*3/uL (ref 4.0–10.5)

## 2015-09-08 LAB — I-STAT BETA HCG BLOOD, ED (MC, WL, AP ONLY): I-stat hCG, quantitative: <5 m[IU]/mL (ref <5)

## 2015-09-08 LAB — LIPASE, BLOOD: Lipase: 27 U/L (ref 11–51)

## 2015-09-08 MED ORDER — ONDANSETRON 4 MG PO TBDP
4.0000 mg | ORAL_TABLET | Freq: Once | ORAL | Status: AC | PRN
  Administered 2015-09-08: 4 mg via ORAL

## 2015-09-08 MED ORDER — ONDANSETRON HCL 4 MG PO TABS: 4.0000 mg | ORAL_TABLET | Freq: Four times a day (QID) | ORAL | Status: AC

## 2015-09-08 MED ORDER — ONDANSETRON HCL 4 MG/2ML IJ SOLN: 4.0000 mg | Freq: Once | INTRAMUSCULAR | Status: DC

## 2015-09-08 MED ORDER — ONDANSETRON 4 MG PO TBDP
4.0000 mg | ORAL_TABLET | Freq: Once | ORAL | Status: AC
  Administered 2015-09-08: 4 mg via ORAL
  Filled 2015-09-08: qty 1

## 2015-09-08 MED ORDER — ONDANSETRON 4 MG PO TBDP
ORAL_TABLET | ORAL | Status: AC
  Filled 2015-09-08: qty 1

## 2015-09-08 MED ORDER — MORPHINE SULFATE (PF) 4 MG/ML IV SOLN: 4.0000 mg | Freq: Once | INTRAVENOUS | Status: DC

## 2015-09-08 NOTE — ED Notes (Signed)
Upon entering room, pt states she would like to go home and not do the IV medication, PA Dahlia ClientHannah notified, states she will complete discharge orders

## 2015-09-08 NOTE — Discharge Instructions (Signed)
Abdominal Pain, Adult Stay well-hydrated. Take Zofran for nausea. Follow-up with her primary care physician. Return for inability to tolerate fluids or increased abdominal pain. Many things can cause abdominal pain. Usually, abdominal pain is not caused by a disease and will improve without treatment. It can often be observed and treated at home. Your health care provider will do a physical exam and possibly order blood tests and X-rays to help determine the seriousness of your pain. However, in many cases, more time must pass before a clear cause of the pain can be found. Before that point, your health care provider may not know if you need more testing or further treatment. HOME CARE INSTRUCTIONS Monitor your abdominal pain for any changes. The following actions may help to alleviate any discomfort you are experiencing:  Only take over-the-counter or prescription medicines as directed by your health care provider.  Do not take laxatives unless directed to do so by your health care provider.  Try a clear liquid diet (broth, tea, or water) as directed by your health care provider. Slowly move to a bland diet as tolerated. SEEK MEDICAL CARE IF:  You have unexplained abdominal pain.  You have abdominal pain associated with nausea or diarrhea.  You have pain when you urinate or have a bowel movement.  You experience abdominal pain that wakes you in the night.  You have abdominal pain that is worsened or improved by eating food.  You have abdominal pain that is worsened with eating fatty foods.  You have a fever. SEEK IMMEDIATE MEDICAL CARE IF:  Your pain does not go away within 2 hours.  You keep throwing up (vomiting).  Your pain is felt only in portions of the abdomen, such as the right side or the left lower portion of the abdomen.  You pass bloody or black tarry stools. MAKE SURE YOU:  Understand these instructions.  Will watch your condition.  Will get help right away if  you are not doing well or get worse.   This information is not intended to replace advice given to you by your health care provider. Make sure you discuss any questions you have with your health care provider.   Document Released: 05/30/2005 Document Revised: 05/11/2015 Document Reviewed: 04/29/2013 Elsevier Interactive Patient Education 2016 [employer]  Diarrhea Diarrhea is frequent loose and watery bowel movements. It can cause you to feel weak and dehydrated. Dehydration can cause you to become tired and thirsty, have a dry mouth, and have decreased urination that often is dark yellow. Diarrhea is a sign of another problem, most often an infection that will not last long. In most cases, diarrhea typically lasts 2-3 days. However, it can last longer if it is a sign of something more serious. It is important to treat your diarrhea as directed by your caregiver to lessen or prevent future episodes of diarrhea. CAUSES  Some common causes include:  Gastrointestinal infections caused by viruses, bacteria, or parasites.  Food poisoning or food allergies.  Certain medicines, such as antibiotics, chemotherapy, and laxatives.  Artificial sweeteners and fructose.  Digestive disorders. HOME CARE INSTRUCTIONS  Ensure adequate fluid intake (hydration): Have 1 cup (8 oz) of fluid for each diarrhea episode. Avoid fluids that contain simple sugars or sports drinks, fruit juices, whole milk products, and sodas. Your urine should be clear or pale yellow if you are drinking enough fluids. Hydrate with an oral rehydration solution that you can purchase at pharmacies, retail stores, and online. You can prepare an  oral rehydration solution at home by mixing the following ingredients together:   - tsp table salt.   tsp baking soda.   tsp salt substitute containing potassium chloride.  1  tablespoons sugar.  1 L (34 oz) of water.  Certain foods and beverages may increase the speed at which food  moves through the gastrointestinal (GI) tract. These foods and beverages should be avoided and include:  Caffeinated and alcoholic beverages.  High-fiber foods, such as raw fruits and vegetables, nuts, seeds, and whole grain breads and cereals.  Foods and beverages sweetened with sugar alcohols, such as xylitol, sorbitol, and mannitol.  Some foods may be well tolerated and may help thicken stool including:  Starchy foods, such as rice, toast, pasta, low-sugar cereal, oatmeal, grits, baked potatoes, crackers, and bagels.  Bananas.  Applesauce.  Add probiotic-rich foods to help increase healthy bacteria in the GI tract, such as yogurt and fermented milk products.  Wash your hands well after each diarrhea episode.  Only take over-the-counter or prescription medicines as directed by your caregiver.  Take a warm bath to relieve any burning or pain from frequent diarrhea episodes. SEEK IMMEDIATE MEDICAL CARE IF:   You are unable to keep fluids down.  You have persistent vomiting.  You have blood in your stool, or your stools are black and tarry.  You do not urinate in 6-8 hours, or there is only a small amount of very dark urine.  You have abdominal pain that increases or localizes.  You have weakness, dizziness, confusion, or light-headedness.  You have a severe headache.  Your diarrhea gets worse or does not get better.  You have a fever or persistent symptoms for more than 2-3 days.  You have a fever and your symptoms suddenly get worse. MAKE SURE YOU:   Understand these instructions.  Will watch your condition.  Will get help right away if you are not doing well or get worse.   This information is not intended to replace advice given to you by your health care provider. Make sure you discuss any questions you have with your health care provider.   Document Released: 08/10/2002 Document Revised: 09/10/2014 Document Reviewed: 04/27/2012 Elsevier Interactive Patient  Education [employer]

## 2015-09-08 NOTE — ED Provider Notes (Signed)
CSN: [id number]     Arrival date & time 09/08/15  1006 History   First MD Initiated Contact with Patient 09/08/15 1553     Chief Complaint  Patient presents with  . Abdominal Pain  . Emesis   (Consider location/radiation/quality/duration/timing/severity/associated sxs/prior Treatment) The history is provided by the patient and the spouse. No language interpreter was used.    This Katie George is a 30 year old female with a history of GERD, thyroid disease, and PCOS who presents for severe and sudden onset abdominal pain while driving to work at 4:096:30 AM today. She reports that it lasted approximately 5 minutes and resolved. She thought nothing of it and continued working. While at work she began having epigastric and right upper quadrant abdominal pain with several episodes of vomiting. She reports that the abdominal pain comes in waves and is intermittent lasting only a few minutes at a time but that she is doubled over in pain during that time. She also reports a couple episodes of diarrhea but without blood. She denies any treatment prior to arrival. Her last menstrual period was 5 days ago. She is currently on birth control. She denies that the pain is worse after eating. She also reports that she is not eaten anything today. She denies any fever, recent illness, shortness of breath, cough, dysuria, hematuria, or urinary frequency.   Past Medical History  Diagnosis Date  . GERD (gastroesophageal reflux disease)   . Anemia   . Thyroid disease   . PCOS (polycystic ovarian syndrome)    Past Surgical History  Procedure Laterality Date  . Myringotomy  1990  . Adenoidectomy  1990   Family History  Problem Relation Age of Onset  . Hypertension Mother   . Anemia Mother   . Migraines Mother   . Hypertension Maternal Grandmother   . Diabetes Maternal Grandmother   . Heart disease Maternal Grandfather   . Diabetes Paternal Grandfather    Social History  Substance Use Topics  . Smoking  status: Never Smoker   . Smokeless tobacco: Never Used  . Alcohol Use: No   OB History    Gravida Para Term Preterm AB TAB SAB Ectopic Multiple Living   2 2 2       2      Review of Systems  Constitutional: Positive for chills. Negative for fever.  Respiratory: Negative for shortness of breath.   Cardiovascular: Negative for chest pain.  Gastrointestinal: Positive for nausea, vomiting, abdominal pain and diarrhea.  All other systems reviewed and are negative.     Allergies  Penicillins; Diphenhydramine; and Doxycycline  Home Medications   Prior to Admission medications   Medication Sig Start Date End Date Taking? Authorizing Provider  ibuprofen (ADVIL,MOTRIN) 600 MG tablet Take 1 tablet (600 mg total) by mouth every 6 (six) hours. Patient taking differently: Take 600 mg by mouth every 6 (six) hours as needed.  01/20/14  Yes Julio Sicksarol Curtis, NP  levothyroxine (SYNTHROID, LEVOTHROID) 50 MCG tablet Take 50 mcg by mouth daily before breakfast.   Yes Historical Provider, MD  norethindrone-ethinyl estradiol (JUNEL FE,GILDESS FE,LOESTRIN FE) 1-20 MG-MCG tablet Take 1 tablet by mouth daily.   Yes Historical Provider, MD  oxyCODONE-acetaminophen (PERCOCET/ROXICET) 5-325 MG per tablet Take 1-2 tablets by mouth every 4 (four) hours as needed for severe pain (moderate - severe pain). 01/20/14  Yes Julio Sicksarol Curtis, NP  ondansetron (ZOFRAN) 4 MG tablet Take 1 tablet (4 mg total) by mouth every 6 (six) hours. 09/08/15   Catha GosselinHanna Patel-Mills, PA-C  BP 113/67 mmHg  Pulse 81  Temp(Src) 98.5 F (36.9 C) (Oral)  Resp 20  SpO2 100%  LMP 09/02/2015 Physical Exam  Constitutional: She is oriented to person, place, and time. She appears well-developed and well-nourished.  HENT:  Head: Normocephalic and atraumatic.  No jaundice.  Eyes: Conjunctivae are normal.  Neck: Normal range of motion. Neck supple.  Cardiovascular: Normal rate, regular rhythm and normal heart sounds.   Pulmonary/Chest: Effort normal  and breath sounds normal. No respiratory distress. She has no wheezes. She has no rales.  Abdominal: Soft. She exhibits no distension. There is no tenderness. There is no rebound, no guarding and no CVA tenderness.  Epigastric and right upper quadrant abdominal tenderness to palpation. No guarding or rebound. No CVA tenderness. No abdominal distention. Patient is morbidly obese.  Musculoskeletal: Normal range of motion.  Neurological: She is alert and oriented to person, place, and time.  Skin: Skin is warm and dry.  Psychiatric: She has a normal mood and affect.  Nursing note and vitals reviewed.   ED Course  Procedures (including critical care time) Labs Review Labs Reviewed  COMPREHENSIVE METABOLIC PANEL - Abnormal; Notable for the following:    CO2 20 (*)    Glucose, Bld 101 (*)    AST 14 (*)    ALT 13 (*)    All other components within normal limits  CBC - Abnormal; Notable for the following:    RBC 5.38 (*)    MCH 25.7 (*)    All other components within normal limits  URINALYSIS, ROUTINE W REFLEX MICROSCOPIC (NOT AT [facility]) - Abnormal; Notable for the following:    APPearance CLOUDY (*)    Leukocytes, UA SMALL (*)    All other components within normal limits  URINE MICROSCOPIC-ADD ON - Abnormal; Notable for the following:    Squamous Epithelial / LPF 6-30 (*)    Bacteria, UA FEW (*)    All other components within normal limits  LIPASE, BLOOD  I-STAT BETA HCG BLOOD, ED ([facility], [facility], [facility] ONLY)    Imaging Review US Abdomen Limited  09/08/2015  CLINICAL DATA:  Right upper quadrant abdominal pain EXAM: US ABDOMEN LIMITED - RIGHT UPPER QUADRANT COMPARISON:  07/03/2011 FINDINGS: Gallbladder: No gallstones or wall thickening visualized. No sonographic Murphy sign noted by sonographer. Common bile duct: Diameter: 2 mm Liver: No focal lesion identified. Within normal limits in parenchymal echogenicity. IMPRESSION: 1. Normal sonographic appearance of the hepatobiliary system. Electronically  Signed   By: Gaylyn Rong M.D.   On: 09/08/2015 16:54   I have personally reviewed and evaluated these images and lab results as part of my medical decision-making.   EKG Interpretation None      MDM   Final diagnoses:  RUQ abdominal pain  Nausea vomiting and diarrhea   Patient presents for epigastric and right upper quadrant abdominal pain that is intermittent and lasting 5 minutes at a time. She is well-appearing and in no pain now. She denies taking anything prior to arrival. Her labs are unremarkable including her liver function tests, pancreatic enzyme, and bilirubin. Urinalysis is negative for UTI. She is not pregnant.  Due to the location of her pain, I ordered an ultrasound of the gallbladder. She reports chills but is currently afebrile and is not jaundice. Ultrasound of gallbladder shows a normal sonographic appearance. Recheck: I discussed findings with patient and she stated that she was feeling nauseated and felt seasick. Pain medication, fluids, and IV Zofran were ordered but patient refused.  She stated she just wanted Zofran and wanted to go home. I discussed staying well-hydrated. Return precautions were discussed with the patient as well as follow-up and she agrees. Filed Vitals:   09/08/15 1600 09/08/15 1747  BP: 113/67   Pulse: 88 81  Temp:  98.5 F (36.9 C)  Resp:  [address], PA-C 09/08/15 1748  Zadie Rhine, MD 09/09/15 [phone]

## 2015-09-08 NOTE — ED Notes (Signed)
Pt reports at 0630 middle lower abd pain that radiates up to her epigastric area and localizes to RUQ. Has been vomiting x 5. Pain is cramping and intermittent.

## 2016-05-04 ENCOUNTER — Other Ambulatory Visit: Payer: Self-pay | Admitting: Physical Medicine and Rehabilitation

## 2016-05-04 ENCOUNTER — Ambulatory Visit
Admission: RE | Admit: 2016-05-04 | Discharge: 2016-05-04 | Disposition: A | Discharge status: Home / Self Care | Payer: No Typology Code available for payment source | Source: Ambulatory Visit | Attending: Physical Medicine and Rehabilitation | Admitting: Physical Medicine and Rehabilitation

## 2016-05-04 DIAGNOSIS — Z0000 Encounter for general adult medical examination without abnormal findings: Secondary | ICD-10-CM

## 2017-01-21 IMAGING — US US ABDOMEN LIMITED
1 series · 14 of 25 positions shown · non-contrast
Comparison: 07/03/2011

CLINICAL DATA: Right upper quadrant abdominal pain

EXAM:
US ABDOMEN LIMITED - RIGHT UPPER QUADRANT

[Series 1: us abdomen limited · 0.19mm/px · 14 of 53 slices shown]
[im 1/53]
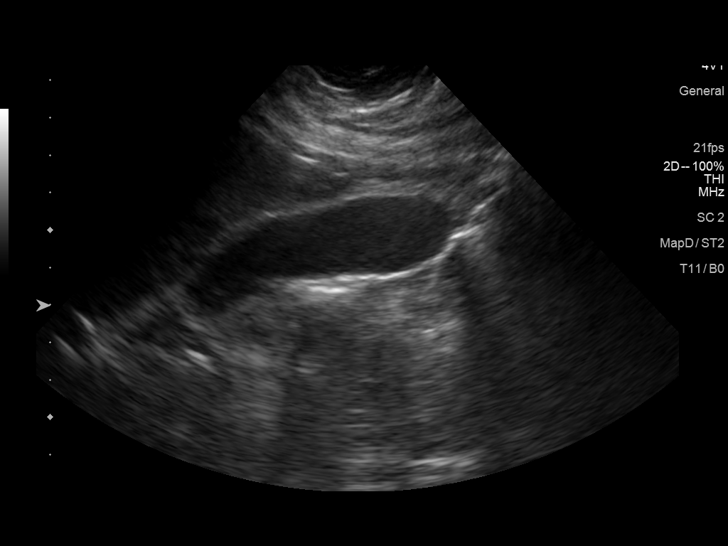
[im 5/53]
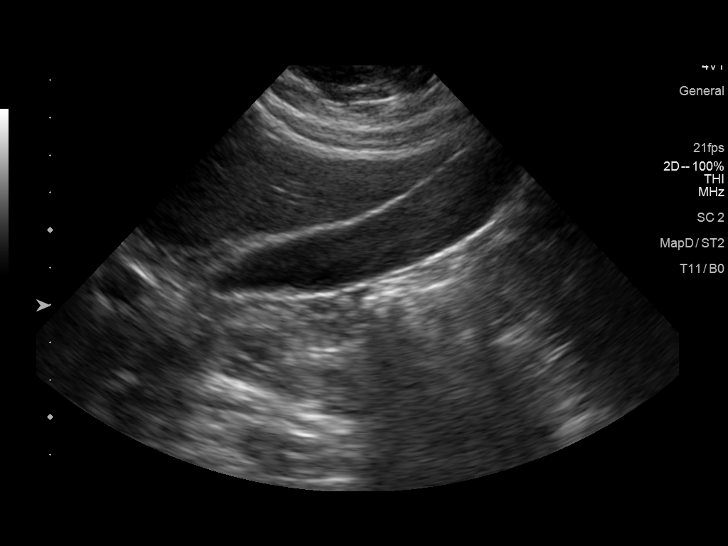
[im 9/53]
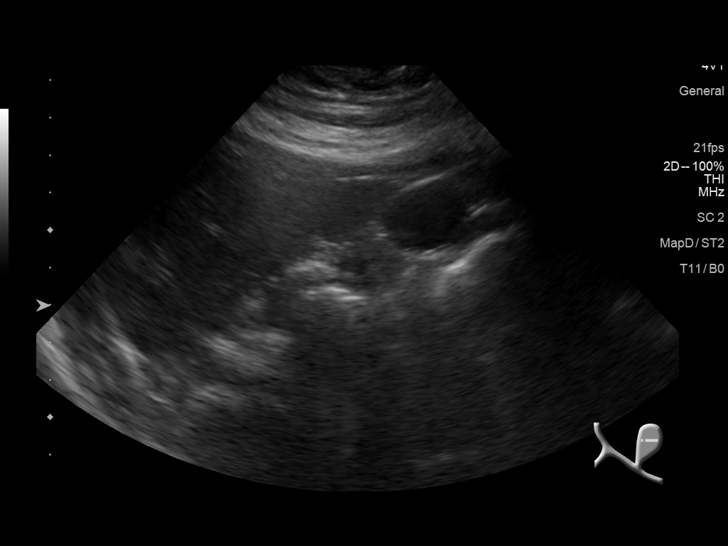
[im 14/53]
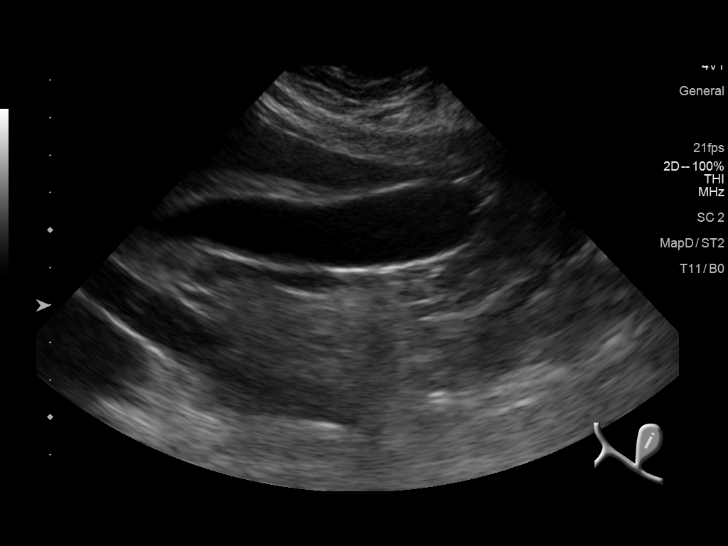
[im 18/53]
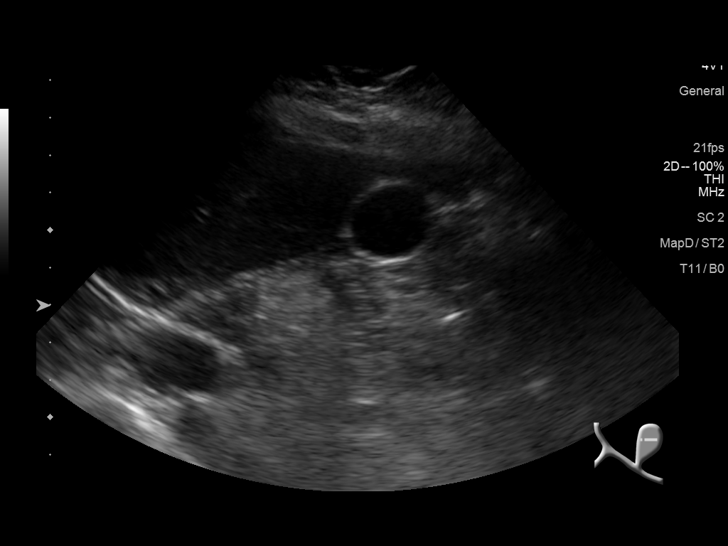
[im 20/53]
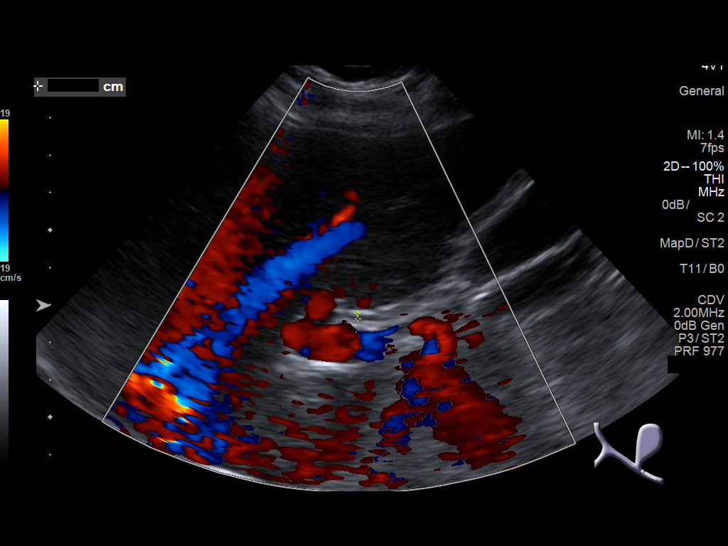
[im 24/53]
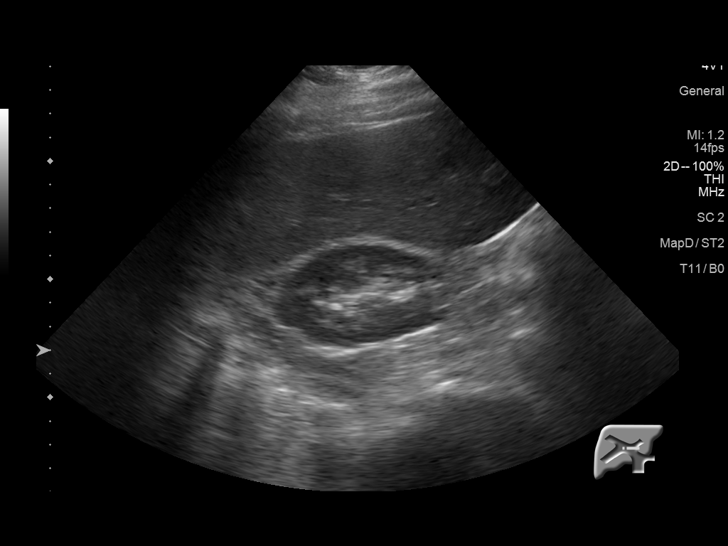
[im 29/53]
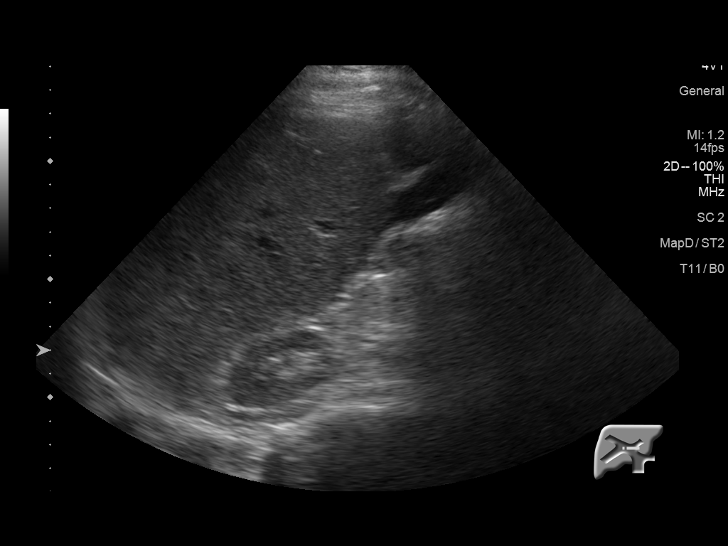
[im 33/53]
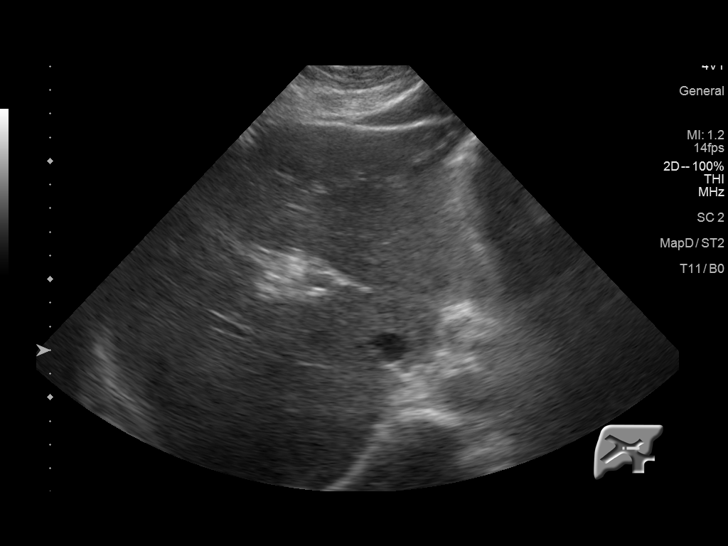
[im 35/53]
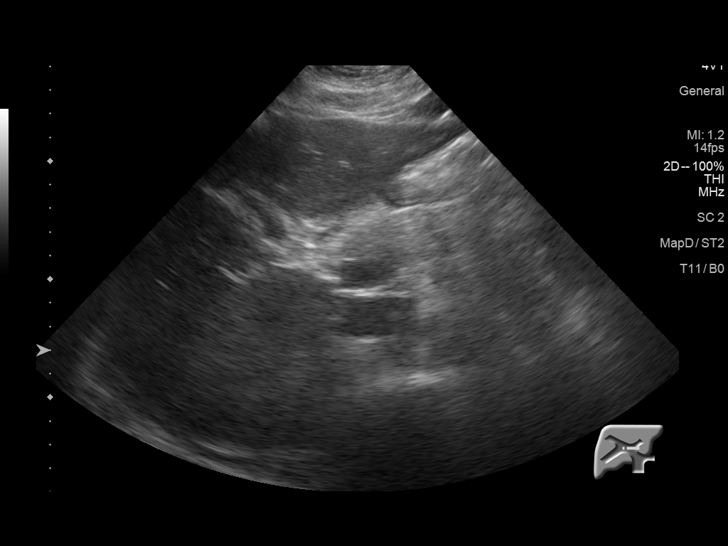
[im 40/53]
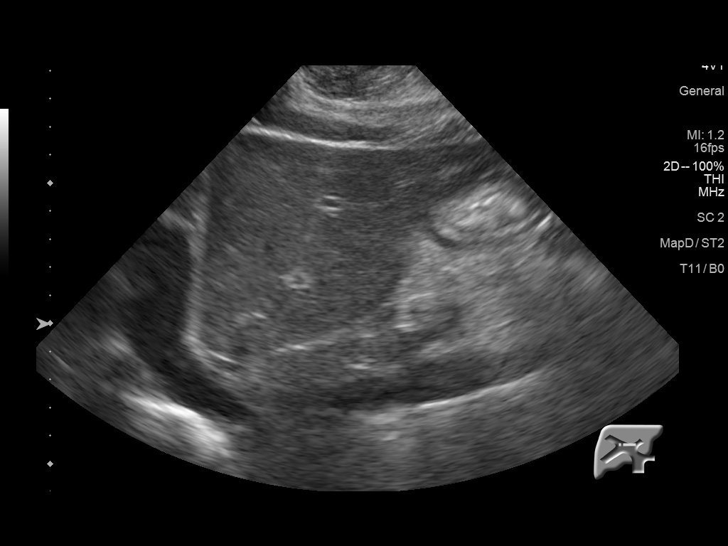
[im 44/53]
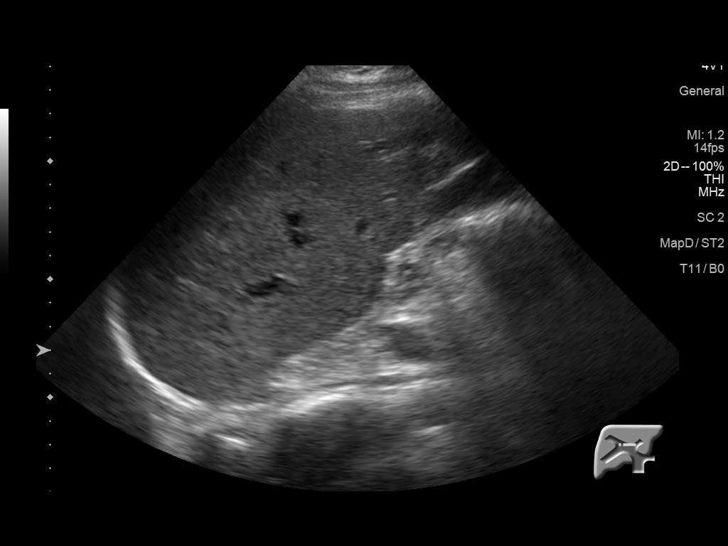
[im 48/53]
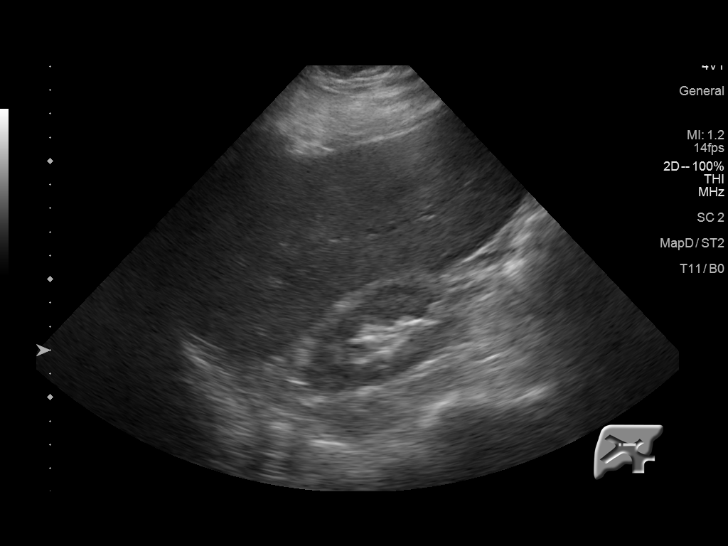
[im 53/53]
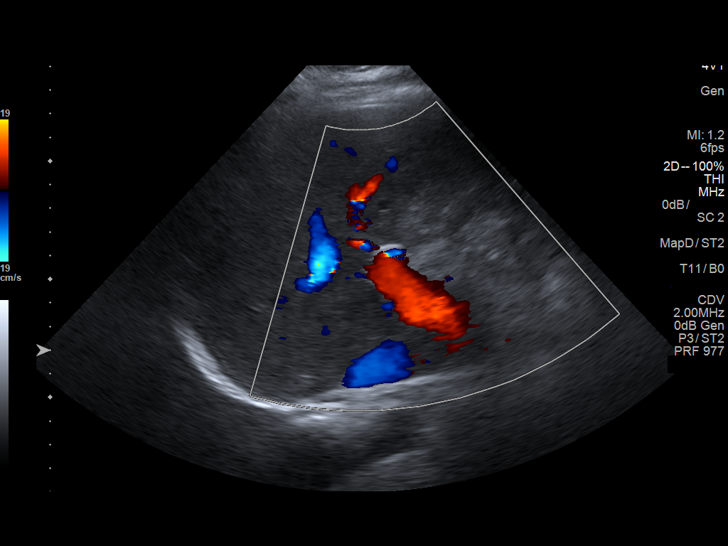

[14 of 25 positions shown; findings below may reference images not displayed]

FINDINGS: Gallbladder:

No gallstones or wall thickening visualized. No sonographic Murphy
sign noted by sonographer.

Common bile duct:

Diameter: 2 mm

Liver:

No focal lesion identified. Within normal limits in parenchymal
echogenicity.
IMPRESSION: 1. Normal sonographic appearance of the hepatobiliary system.

## 2017-07-14 IMAGING — CR DG CHEST 1V
1 series · 1 of 1 positions shown · non-contrast
Comparison: None.

CLINICAL DATA: Physical examination. Employee evaluation. No
current symptoms reported.

EXAM:
CHEST 1 VIEW

[w chest pa]
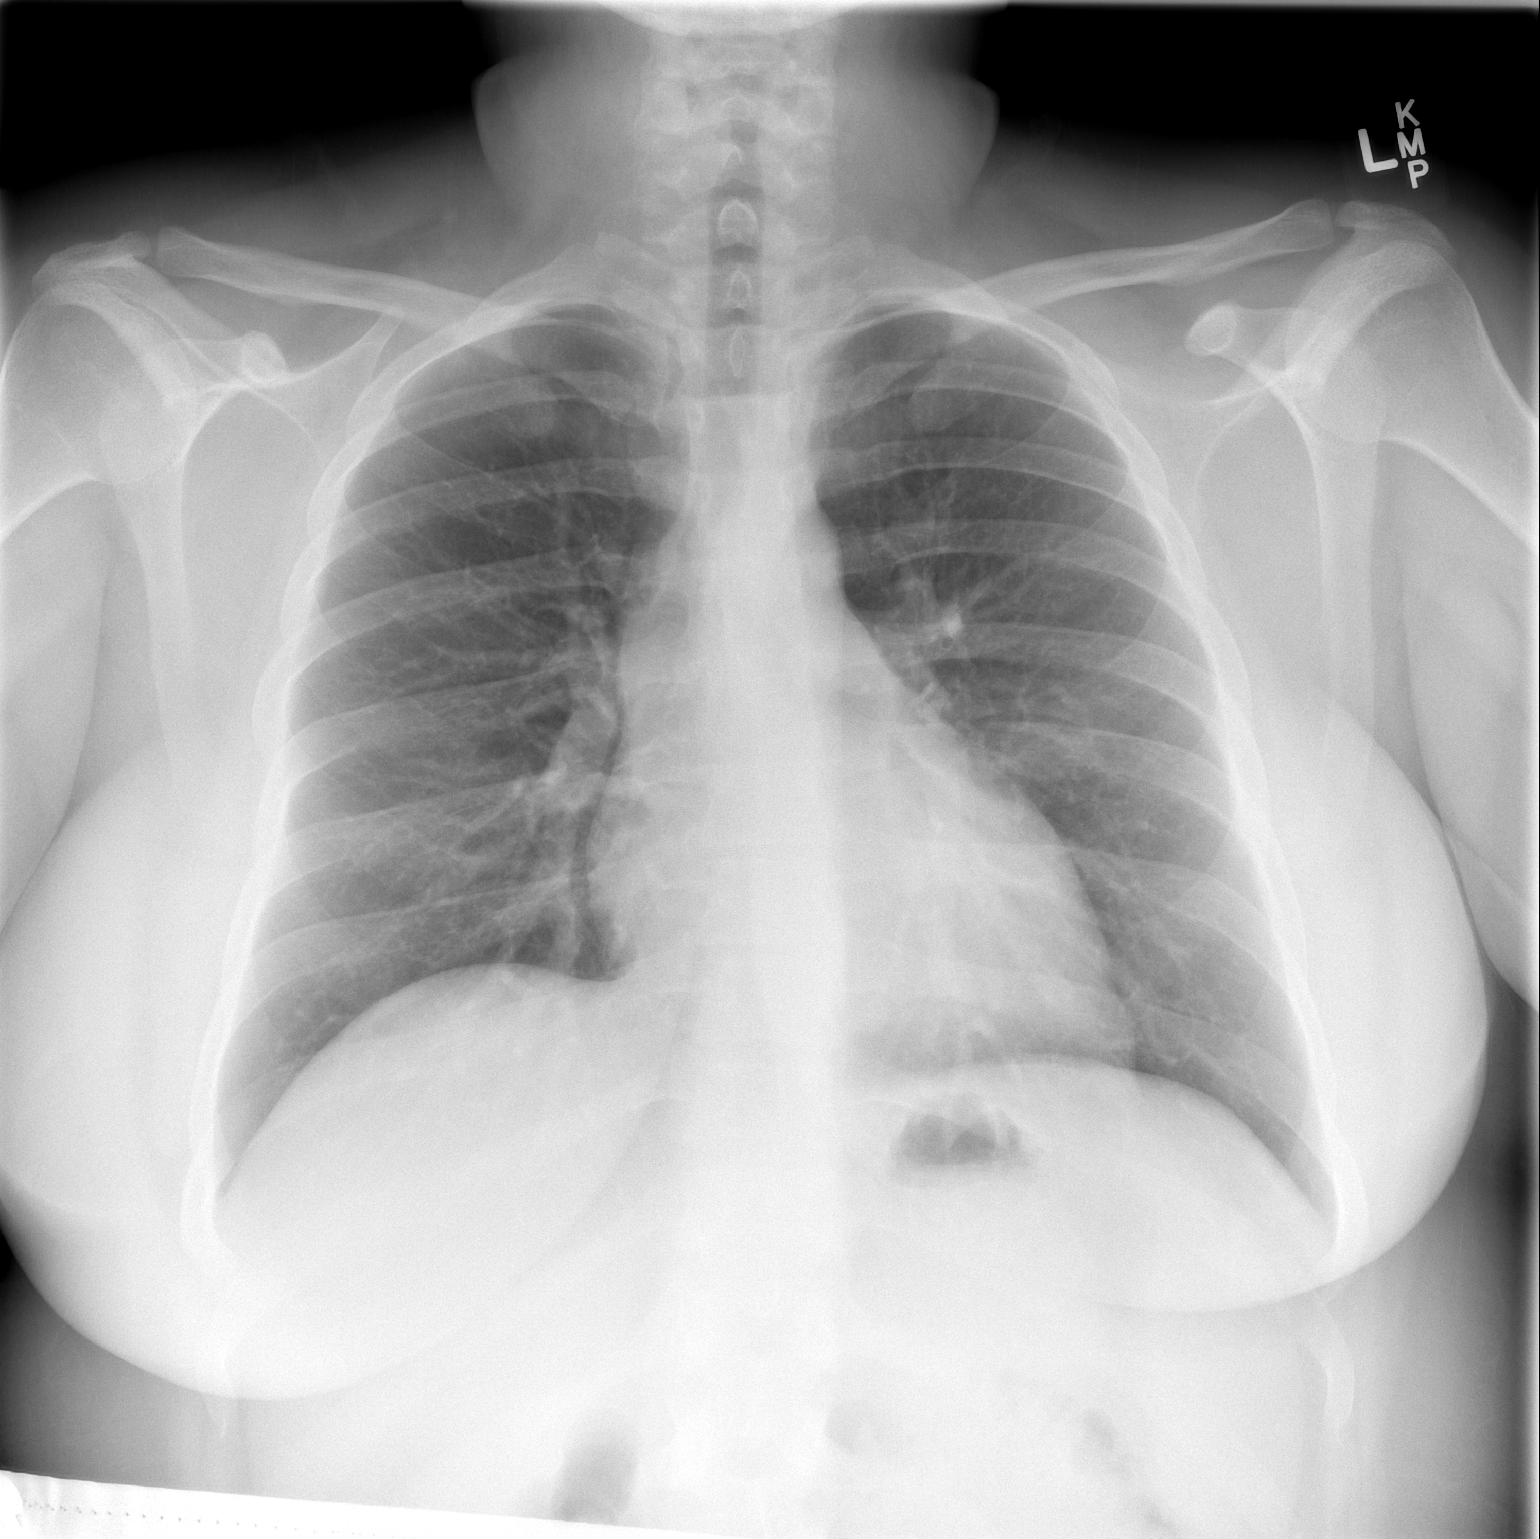

[1 of 1 positions shown; findings below may reference images not displayed]

FINDINGS: Normal heart size. Normal mediastinal contour. No pneumothorax. No
pleural effusion. Lungs appear clear, with no acute consolidative
airspace disease and no pulmonary edema. Visualized osseous
structures appear intact.
IMPRESSION: No active disease.
